# Patient Record
Sex: Male | Born: 1955 | Race: White | Hispanic: No | Marital: Married | Smoking: Former smoker
Health system: Southern US, Community
[De-identification: ages and names within clinical notes are randomized; demographics above are authoritative.]

## PROBLEM LIST (undated history)

## (undated) DIAGNOSIS — E785 Hyperlipidemia, unspecified: Secondary | ICD-10-CM

## (undated) DIAGNOSIS — I10 Essential (primary) hypertension: Secondary | ICD-10-CM

## (undated) HISTORY — DX: Essential (primary) hypertension: I10

## (undated) HISTORY — PX: APPENDECTOMY: SHX54

## (undated) HISTORY — DX: Hyperlipidemia, unspecified: E78.5

---

## 2007-02-24 LAB — HEAVY METALS PROFILE II, BLOOD
Arsenic, Blood: 8
Mercury, Blood: 2

## 2010-02-16 LAB — LIPID PANEL
CHOLESTEROL: 296 mg/dL — AB (ref 0–200)
HDL: 75 mg/dL — AB (ref 35–70)
LDL CALC: 191 mg/dL
TRIGLYCERIDES: 154 mg/dL (ref 40–160)

## 2010-02-16 LAB — BASIC METABOLIC PANEL
Creatinine: 0.9 mg/dL (ref 0.6–1.3)
Potassium: 4.2 mmol/L (ref 3.4–5.3)

## 2010-02-16 LAB — PSA: PSA: 1

## 2010-02-16 LAB — OCCULT BLOOD (STOOL CUP TO LAB): OCCULT BLOOD, FECES: NEGATIVE

## 2015-01-23 ENCOUNTER — Ambulatory Visit (INDEPENDENT_AMBULATORY_CARE_PROVIDER_SITE_OTHER): Payer: 59 | Admitting: Family Medicine

## 2015-01-23 ENCOUNTER — Encounter: Payer: Self-pay | Admitting: Family Medicine

## 2015-01-23 VITALS — BP 146/86 | HR 74 | Ht 71.0 in | Wt 199.0 lb

## 2015-01-23 DIAGNOSIS — G4730 Sleep apnea, unspecified: Secondary | ICD-10-CM

## 2015-01-23 DIAGNOSIS — E559 Vitamin D deficiency, unspecified: Secondary | ICD-10-CM

## 2015-01-23 DIAGNOSIS — Z0000 Encounter for general adult medical examination without abnormal findings: Secondary | ICD-10-CM | POA: Diagnosis not present

## 2015-01-23 DIAGNOSIS — F418 Other specified anxiety disorders: Secondary | ICD-10-CM | POA: Diagnosis not present

## 2015-01-23 DIAGNOSIS — G4700 Insomnia, unspecified: Secondary | ICD-10-CM | POA: Diagnosis not present

## 2015-01-23 DIAGNOSIS — Z23 Encounter for immunization: Secondary | ICD-10-CM

## 2015-01-23 DIAGNOSIS — IMO0001 Reserved for inherently not codable concepts without codable children: Secondary | ICD-10-CM

## 2015-01-23 DIAGNOSIS — J302 Other seasonal allergic rhinitis: Secondary | ICD-10-CM

## 2015-01-23 DIAGNOSIS — E785 Hyperlipidemia, unspecified: Secondary | ICD-10-CM

## 2015-01-23 DIAGNOSIS — Z1211 Encounter for screening for malignant neoplasm of colon: Secondary | ICD-10-CM

## 2015-01-23 DIAGNOSIS — Z889 Allergy status to unspecified drugs, medicaments and biological substances status: Secondary | ICD-10-CM

## 2015-01-23 DIAGNOSIS — G4733 Obstructive sleep apnea (adult) (pediatric): Secondary | ICD-10-CM | POA: Insufficient documentation

## 2015-01-23 DIAGNOSIS — Z789 Other specified health status: Secondary | ICD-10-CM

## 2015-01-23 LAB — COMPREHENSIVE METABOLIC PANEL
ALT: 40 U/L (ref 9–46)
AST: 33 U/L (ref 10–35)
Albumin: 4.5 g/dL (ref 3.6–5.1)
Alkaline Phosphatase: 52 U/L (ref 40–115)
BUN: 14 mg/dL (ref 7–25)
CHLORIDE: 97 mmol/L — AB (ref 98–110)
CO2: 26 mmol/L (ref 20–31)
CREATININE: 0.81 mg/dL (ref 0.70–1.33)
Calcium: 9 mg/dL (ref 8.6–10.3)
GLUCOSE: 121 mg/dL — AB (ref 65–99)
Potassium: 4 mmol/L (ref 3.5–5.3)
SODIUM: 137 mmol/L (ref 135–146)
Total Bilirubin: 1.4 mg/dL — ABNORMAL HIGH (ref 0.2–1.2)
Total Protein: 7 g/dL (ref 6.1–8.1)

## 2015-01-23 LAB — CBC
HCT: 43.6 % (ref 39.0–52.0)
Hemoglobin: 15.2 g/dL (ref 13.0–17.0)
MCH: 31.7 pg (ref 26.0–34.0)
MCHC: 34.9 g/dL (ref 30.0–36.0)
MCV: 90.8 fL (ref 78.0–100.0)
MPV: 8.8 fL (ref 8.6–12.4)
PLATELETS: 247 10*3/uL (ref 150–400)
RBC: 4.8 MIL/uL (ref 4.22–5.81)
RDW: 13.4 % (ref 11.5–15.5)
WBC: 6.7 10*3/uL (ref 4.0–10.5)

## 2015-01-23 LAB — LIPID PANEL
CHOL/HDL RATIO: 3.1 ratio (ref <=5.0)
CHOLESTEROL: 289 mg/dL — AB (ref 125–200)
HDL: 94 mg/dL (ref >=40)
LDL CALC: 172 mg/dL — AB (ref <130)
Triglycerides: 113 mg/dL (ref <150)
VLDL: 23 mg/dL (ref <30)

## 2015-01-23 MED ORDER — ZOLPIDEM TARTRATE 10 MG PO TABS: 10.0000 mg | ORAL_TABLET | Freq: Every evening | ORAL | Status: DC | PRN

## 2015-01-23 MED ORDER — ALPRAZOLAM 0.25 MG PO TABS: 0.2500 mg | ORAL_TABLET | Freq: Three times a day (TID) | ORAL | Status: DC | PRN

## 2015-01-23 NOTE — Assessment & Plan Note (Signed)
I recommended a home sleep study. Patient declined and wishes to research on his own first. Recheck in one month.

## 2015-01-23 NOTE — Assessment & Plan Note (Signed)
Take Ambien

## 2015-01-23 NOTE — Assessment & Plan Note (Signed)
Check cholesterol panel. If panel is remarkably bad would recommend starting Livalo as he is intolerant to Lipitor

## 2015-01-23 NOTE — Assessment & Plan Note (Addendum)
Doing well. Obtain screening labs. Tdap and influenza vaccines given prior to discharge. Return in one month to recheck blood pressure. Colonoscopy ordered

## 2015-01-23 NOTE — Assessment & Plan Note (Signed)
Very infrequent usage of Xanax is okay

## 2015-01-23 NOTE — Patient Instructions (Addendum)
Thank you for coming in today. Call or go to the emergency room if you get worse, have trouble breathing, have chest pains, or palpitations.  1) Research sleep apnea 2) Colon Cancer Screening: Expect to be called soon about arranging for a colonoscopy to screen for colon cancer.  3) We will get labs today. My nurse will call you with results.  4) Return in 1 month for blood pressure check with me.

## 2015-01-23 NOTE — Progress Notes (Signed)
Spencer Wang is a 59 y.o. male who presents to [facility]  today for establish care and perform wellness visit.  Patient recently moved back to [place]. He works as a Manufacturing engineersemiconductor engineer.  He has several chronic health concerns including insomnia, situational anxiety, sleep apnea, elevated blood pressure, and elevated cholesterol. He feels well with no acute issues today. He would like refill of his Ambien and refill of his Xanax. He uses 0.25 mg of Xanax very intermittently. For example 1 pill bottle will typically last a half a year.  1) hyperlipidemia: Has been elevated in the past. Patient has a family history of coronary artery bypass surgery in his father. The patient in the past has been on Lipitor which caused muscle cramping. He does not take any medications currently.  2) elevated blood pressure: Patient has been told that he has elevated blood pressure in the past. He does not take blood pressure medications. He denies any chest pains or palpitations.  3) anxiety: Patient has situational anxiety. He's had a bad experience the dentist and his path in his youth in [place]. Typically his anxiety is well-controlled and not bothersome.  4) sleep apnea: Patient has been told he has sleep apnea. He is not sure if he's ever had a formal sleep study. He does not use a Pap machine. He notes he wakes from sleep occasionally gasping for breath. His stop bang is very positive (7/8).  5) patient states that his last colonoscopy was about 10 years ago. He thinks it was normal. He does have problems with his stomach in the past. 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  Vaccines: Patient is due for influenza and Tdap vaccines today.   Past Medical History  Diagnosis Date  . Hyperlipidemia   . Hypertension    Past Surgical History  Procedure Laterality Date  . Appendectomy     Social History  Substance Use Topics  .  Smoking status: Former Games developermoker  . Smokeless tobacco: Not on file  . Alcohol Use: 0.0 oz/week    0 Standard drinks or equivalent per week   family history includes Hyperlipidemia in his father; Hypertension in his father.  ROS as above Medications: Current Outpatient Prescriptions  Medication Sig Dispense Refill  . ALPRAZolam (XANAX) 0.25 MG tablet Take 1 tablet (0.25 mg total) by mouth 3 (three) times daily as needed for anxiety. 30 tablet 0  . zolpidem (AMBIEN) 10 MG tablet Take 1 tablet (10 mg total) by mouth at bedtime as needed for sleep. 30 tablet 3   No current facility-administered medications for this visit.   Allergies  Allergen Reactions  . Lipitor [Atorvastatin] Other (See Comments)    Myalgia     Exam:  BP 146/86 mmHg  Pulse 74  Ht 5\' 11"  (1.803 m)  Wt 199 lb (90.266 kg)  BMI 27.77 kg/m2 Gen: Well NAD HEENT: EOMI,  MMM large neck Lungs: Normal work of breathing. CTABL Heart: RRR no MRG Abd: NABS, Soft. Nondistended, Nontender Exts: Brisk capillary refill, warm and well perfused.   No results found for this or any previous visit (from the past 24 hour(s)). No results found.   Please see individual assessment and plan sections.

## 2015-01-24 DIAGNOSIS — E559 Vitamin D deficiency, unspecified: Secondary | ICD-10-CM | POA: Insufficient documentation

## 2015-01-24 LAB — HEPATITIS C ANTIBODY: HCV AB: NEGATIVE

## 2015-01-24 LAB — VITAMIN D 25 HYDROXY (VIT D DEFICIENCY, FRACTURES): VIT D 25 HYDROXY: 27 ng/mL — AB (ref 30–100)

## 2015-01-24 LAB — HIV ANTIBODY (ROUTINE TESTING W REFLEX): HIV: NONREACTIVE

## 2015-01-24 MED ORDER — VITAMIN D (ERGOCALCIFEROL) 1.25 MG (50000 UNIT) PO CAPS: 50000.0000 [IU] | ORAL_CAPSULE | ORAL | Status: DC

## 2015-01-24 NOTE — Progress Notes (Signed)
Quick Note:  Rx vit D. No pharmacy in system. Please fax to preferred pharmacy. ______

## 2015-01-24 NOTE — Addendum Note (Signed)
Addended by: Rodolph BongOREY, [place] Nabor S on: 01/24/2015 07:54 AM   Modules accepted: Orders

## 2015-01-24 NOTE — Progress Notes (Signed)
Quick Note:  1) Vit D is low. I will prescribe Vit D pills.  2) Cholesterol is bad. We will discuss this further at the 1 month blood pressure check. ______

## 2015-01-27 ENCOUNTER — Telehealth: Payer: Self-pay | Admitting: Family Medicine

## 2015-01-27 NOTE — Telephone Encounter (Signed)
Pt called anf                  Pt called and stated the pharmacy he needs him medication to go to [facility] [address] in [place].

## 2015-02-17 ENCOUNTER — Encounter: Payer: Self-pay | Admitting: Family Medicine

## 2015-02-17 DIAGNOSIS — G609 Hereditary and idiopathic neuropathy, unspecified: Secondary | ICD-10-CM | POA: Insufficient documentation

## 2015-02-22 ENCOUNTER — Encounter: Payer: Self-pay | Admitting: Family Medicine

## 2015-02-24 ENCOUNTER — Ambulatory Visit: Payer: 59 | Admitting: Family Medicine

## 2015-02-27 ENCOUNTER — Encounter: Payer: Self-pay | Admitting: Family Medicine

## 2015-02-27 ENCOUNTER — Ambulatory Visit (INDEPENDENT_AMBULATORY_CARE_PROVIDER_SITE_OTHER): Payer: 59 | Admitting: Family Medicine

## 2015-02-27 VITALS — BP 164/99 | HR 97 | Wt 200.0 lb

## 2015-02-27 DIAGNOSIS — I10 Essential (primary) hypertension: Secondary | ICD-10-CM | POA: Diagnosis not present

## 2015-02-27 DIAGNOSIS — E785 Hyperlipidemia, unspecified: Secondary | ICD-10-CM | POA: Diagnosis not present

## 2015-02-27 DIAGNOSIS — R7301 Impaired fasting glucose: Secondary | ICD-10-CM | POA: Insufficient documentation

## 2015-02-27 DIAGNOSIS — E559 Vitamin D deficiency, unspecified: Secondary | ICD-10-CM | POA: Diagnosis not present

## 2015-02-27 LAB — POCT GLYCOSYLATED HEMOGLOBIN (HGB A1C): HEMOGLOBIN A1C: 5.9

## 2015-02-27 MED ORDER — VITAMIN D (ERGOCALCIFEROL) 1.25 MG (50000 UNIT) PO CAPS: 50000.0000 [IU] | ORAL_CAPSULE | ORAL | Status: DC

## 2015-02-27 MED ORDER — HYDROCHLOROTHIAZIDE 25 MG PO TABS: 25.0000 mg | ORAL_TABLET | Freq: Every day | ORAL | Status: DC

## 2015-02-27 NOTE — Assessment & Plan Note (Signed)
A1c at goal. Focus on weight loss

## 2015-02-27 NOTE — Assessment & Plan Note (Signed)
Refill vitamin D 

## 2015-02-27 NOTE — Assessment & Plan Note (Signed)
Start hydrochlorothiazide and check one month

## 2015-02-27 NOTE — Progress Notes (Signed)
Spencer Wang is a 59 y.o. male who presents [facility] today for follow up blood pressure.   1) patient had elevated blood pressure last checked. Today it still elevated. He denies any chest pain palpitations or shortness of breath.  2) vitamin D: Patient was found to have low vitamin D levels at the last visit. He was prescribed 8 tablets of once weekly vitamin D 50,000 units. He only received 4 at the pharmacy. He thinks he has a refill left. He takes the medicine without any issues.  3) hyperlipidemia: Patient was found to have hyperlipidemia at the last visit. In the past he's had significant difficulty tolerating Lipitor due to myalgia.  4) impaired fasting glucose: At the last visit patient was found to have a blood sugar of 120s fasting. He denies any polyuria polydipsia or unintended weight loss.   Past Medical History  Diagnosis Date  . Hyperlipidemia   . Hypertension    Past Surgical History  Procedure Laterality Date  . Appendectomy     Social History  Substance Use Topics  . Smoking status: Former Games developermoker  . Smokeless tobacco: Not on file  . Alcohol Use: 0.0 oz/week    0 Standard drinks or equivalent per week   family history includes Hyperlipidemia in his father; Hypertension in his father.  ROS as above Medications: Current Outpatient Prescriptions  Medication Sig Dispense Refill  . ALPRAZolam (XANAX) 0.25 MG tablet Take 1 tablet (0.25 mg total) by mouth 3 (three) times daily as needed for anxiety. 30 tablet 0  . Vitamin D, Ergocalciferol, (DRISDOL) 50000 UNITS CAPS capsule Take 1 capsule (50,000 Units total) by mouth every 7 (seven) days. Take for 8 total doses(weeks) 4 capsule 0  . zolpidem (AMBIEN) 10 MG tablet Take 1 tablet (10 mg total) by mouth at bedtime as needed for sleep. 30 tablet 3  . hydrochlorothiazide (HYDRODIURIL) 25 MG tablet Take 1 tablet (25 mg total) by mouth daily. 30 tablet 0   No current  facility-administered medications for this visit.   Allergies  Allergen Reactions  . Lipitor [Atorvastatin] Other (See Comments)    Myalgia     Exam:  BP 164/99 mmHg  Pulse 97  Wt 200 lb (90.719 kg) Gen: Well NAD HEENT: EOMI,  MMM Lungs: Normal work of breathing. CTABL Heart: RRR no MRG Abd: NABS, Soft. Nondistended, Nontender Exts: Brisk capillary refill, warm and well perfused.   Results for orders placed or performed in visit on 02/27/15 (from the past 24 hour(s))  POCT HgB A1C     Status: None   Collection Time: 02/27/15  9:07 AM  Result Value Ref Range   Hemoglobin A1C 5.9    No results found.   Please see individual assessment and plan sections.

## 2015-02-27 NOTE — Assessment & Plan Note (Signed)
Consider starting pitivastatin at next visit

## 2015-03-26 ENCOUNTER — Other Ambulatory Visit: Payer: Self-pay | Admitting: Family Medicine

## 2015-04-18 ENCOUNTER — Encounter: Payer: Self-pay | Admitting: Family Medicine

## 2015-04-18 DIAGNOSIS — K635 Polyp of colon: Secondary | ICD-10-CM | POA: Insufficient documentation

## 2015-04-23 ENCOUNTER — Other Ambulatory Visit: Payer: Self-pay | Admitting: Family Medicine

## 2015-04-24 ENCOUNTER — Telehealth: Payer: Self-pay | Admitting: Family Medicine

## 2015-04-24 NOTE — Telephone Encounter (Signed)
Left VM for Pt advising he is due for a follow up on his hypertension. Callback information for scheduling provided.

## 2015-04-24 NOTE — Telephone Encounter (Signed)
I called pt to let him know he is due for a follow up on his Hypertension and he states he will call back to schedule an appointment time. Thanks, [employer]

## 2015-05-06 ENCOUNTER — Other Ambulatory Visit: Payer: Self-pay | Admitting: Family Medicine

## 2015-05-08 ENCOUNTER — Telehealth: Payer: Self-pay | Admitting: Family Medicine

## 2015-05-08 NOTE — Telephone Encounter (Signed)
I left message informing pt that he needs to call and schedule his f/u app on BP so he can get more refills

## 2015-05-11 ENCOUNTER — Ambulatory Visit (INDEPENDENT_AMBULATORY_CARE_PROVIDER_SITE_OTHER): Payer: 59 | Admitting: Family Medicine

## 2015-05-11 ENCOUNTER — Encounter: Payer: Self-pay | Admitting: Family Medicine

## 2015-05-11 VITALS — BP 151/85 | HR 75 | Ht 71.0 in | Wt 201.0 lb

## 2015-05-11 DIAGNOSIS — E559 Vitamin D deficiency, unspecified: Secondary | ICD-10-CM

## 2015-05-11 DIAGNOSIS — R21 Rash and other nonspecific skin eruption: Secondary | ICD-10-CM | POA: Diagnosis not present

## 2015-05-11 DIAGNOSIS — F418 Other specified anxiety disorders: Secondary | ICD-10-CM

## 2015-05-11 DIAGNOSIS — I10 Essential (primary) hypertension: Secondary | ICD-10-CM

## 2015-05-11 MED ORDER — CLOBETASOL PROPIONATE 0.05 % EX CREA: 1.0000 "application " | TOPICAL_CREAM | Freq: Two times a day (BID) | CUTANEOUS | Status: DC

## 2015-05-11 MED ORDER — ALPRAZOLAM 0.25 MG PO TABS: 0.2500 mg | ORAL_TABLET | Freq: Three times a day (TID) | ORAL | Status: DC | PRN

## 2015-05-11 MED ORDER — TRIAMCINOLONE ACETONIDE 0.1 % EX CREA: 1.0000 "application " | TOPICAL_CREAM | Freq: Two times a day (BID) | CUTANEOUS | Status: DC

## 2015-05-11 MED ORDER — HYDROCHLOROTHIAZIDE 25 MG PO TABS: 25.0000 mg | ORAL_TABLET | Freq: Every day | ORAL | Status: DC

## 2015-05-11 NOTE — Progress Notes (Signed)
       Spencer Wang is a 60 y.o. male who presents to [facility]: [facility] Care today for rash and follow-up hypertension.  1) rash: New rash present for the last week and a half. Rash occurred after patient had a colonoscopy. Likely he was outside gardening before the rash started. Nobody else in his household has a similar rash. He denies any new medications aside from the ones that were given as part of a colonoscopy. He denies any fevers chills nausea vomiting or diarrhea. He denies any new soap detergent cosmetics shampoos etc. He's tried some hydrocortisone cream which has helped that.  2) hypertension: Currently taking hydrochlorothiazide. He notes at home his blood pressure is typically well controlled with systolic blood pressure in the 120s. He denies any chest pain palpitations or shortness of breath.   Past Medical History  Diagnosis Date  . Hyperlipidemia   . Hypertension    Past Surgical History  Procedure Laterality Date  . Appendectomy     Social History  Substance Use Topics  . Smoking status: Former Games developer  . Smokeless tobacco: Not on file  . Alcohol Use: 0.0 oz/week    0 Standard drinks or equivalent per week   family history includes Hyperlipidemia in his father; Hypertension in his father.  ROS as above Medications: Current Outpatient Prescriptions  Medication Sig Dispense Refill  . ALPRAZolam (XANAX) 0.25 MG tablet Take 1 tablet (0.25 mg total) by mouth 3 (three) times daily as needed for anxiety. 30 tablet 0  . hydrochlorothiazide (HYDRODIURIL) 25 MG tablet Take 1 tablet (25 mg total) by mouth daily. 30 tablet 0  . zolpidem (AMBIEN) 10 MG tablet Take 1 tablet (10 mg total) by mouth at bedtime as needed for sleep. 30 tablet 3  . clobetasol cream (TEMOVATE) 0.05 % Apply 1 application topically 2 (two) times daily. 60 g 2  . triamcinolone cream (KENALOG) 0.1 % Apply 1  application topically 2 (two) times daily. 453.6 g 1   No current facility-administered medications for this visit.   Allergies  Allergen Reactions  . Lipitor [Atorvastatin] Other (See Comments)    Myalgia     Exam:  BP 151/85 mmHg  Pulse 75  Ht  (1.803 m)  Wt 201 lb (91.173 kg)  BMI 28.05 kg/m2 Gen: Well NAD HEENT: EOMI,  MMM Lungs: Normal work of breathing. CTABL Heart: RRR no MRG Abd: NABS, Soft. Nondistended, Nontender Exts: Brisk capillary refill, warm and well perfused.  Skin: Maculopapular erythematous blanching scattered rash on back. Nontender. No scaling or discharge.  No results found for this or any previous visit (from the past 24 hour(s)). No results found.   Please see individual assessment and plan sections.

## 2015-05-11 NOTE — Patient Instructions (Addendum)
Thank you for coming in today. Use the triamcinolone cream twice daily until rash goes away.  Use the small tube of clobetasol cream for the very bad spots twice daily.  Return in 1 month to recheck blood pressure and check your blood pressure cuff.    Rash A rash is a change in the color or texture of the skin. There are many different types of rashes. You may have other problems that accompany your rash. CAUSES   Infections.  Allergic reactions. This can include allergies to pets or foods.  Certain medicines.  Exposure to certain chemicals, soaps, or cosmetics.  Heat.  Exposure to poisonous plants.  Tumors, both cancerous and noncancerous. SYMPTOMS   Redness.  Scaly skin.  Itchy skin.  Dry or cracked skin.  Bumps.  Blisters.  Pain. DIAGNOSIS  Your caregiver may do a physical exam to determine what type of rash you have. A skin sample (biopsy) may be taken and examined under a microscope. TREATMENT  Treatment depends on the type of rash you have. Your caregiver may prescribe certain medicines. For serious conditions, you may need to see a skin doctor (dermatologist). HOME CARE INSTRUCTIONS   Avoid the substance that caused your rash.  Do not scratch your rash. This can cause infection.  You may take cool baths to help stop itching.  Only take over-the-counter or prescription medicines as directed by your caregiver.  Keep all follow-up appointments as directed by your caregiver. SEEK IMMEDIATE MEDICAL CARE IF:  You have increasing pain, swelling, or redness.  You have a fever.  You have new or severe symptoms.  You have body aches, diarrhea, or vomiting.  Your rash is not better after 3 days. MAKE SURE YOU:  Understand these instructions.  Will watch your condition.  Will get help right away if you are not doing well or get worse.   This information is not intended to replace advice given to you by your health care provider. Make sure you  discuss any questions you have with your health care provider.   Document Released: 02/22/2002 Document Revised: 03/25/2014 Document Reviewed: 07/20/2014 Elsevier Interactive Patient Education [employer]

## 2015-05-11 NOTE — Assessment & Plan Note (Signed)
Unclear etiology. Treat with triamcinolone and clobetasol cream and ointment. Return if not improving.

## 2015-05-11 NOTE — Assessment & Plan Note (Signed)
Check vitamin D. 

## 2015-05-11 NOTE — Assessment & Plan Note (Signed)
Check basic metabolic panel. Continue hydrochlorothiazide. Return in one month with home blood pressure cuff for calibration. Likely patient has whitecoat hypertension a normal blood pressure at home with current regimen.

## 2015-05-12 LAB — BASIC METABOLIC PANEL
BUN: 22 mg/dL (ref 7–25)
CALCIUM: 9.5 mg/dL (ref 8.6–10.3)
CO2: 25 mmol/L (ref 20–31)
Chloride: 98 mmol/L (ref 98–110)
Creat: 0.8 mg/dL (ref 0.70–1.33)
GLUCOSE: 122 mg/dL — AB (ref 65–99)
Potassium: 3.9 mmol/L (ref 3.5–5.3)
SODIUM: 136 mmol/L (ref 135–146)

## 2015-05-12 LAB — VITAMIN D 25 HYDROXY (VIT D DEFICIENCY, FRACTURES): VIT D 25 HYDROXY: 20 ng/mL — AB (ref 30–100)

## 2015-05-12 NOTE — Progress Notes (Signed)
Quick Note:  Kidneys look normal.  Vit is somehow lower.  Take 5000 units of over the counter vit D daily when you finish the prescription dose. ______

## 2015-12-05 ENCOUNTER — Ambulatory Visit (INDEPENDENT_AMBULATORY_CARE_PROVIDER_SITE_OTHER): Payer: 59 | Admitting: Family Medicine

## 2015-12-05 ENCOUNTER — Encounter: Payer: Self-pay | Admitting: Family Medicine

## 2015-12-05 VITALS — BP 138/84 | HR 82 | Wt 197.0 lb

## 2015-12-05 DIAGNOSIS — F418 Other specified anxiety disorders: Secondary | ICD-10-CM | POA: Diagnosis not present

## 2015-12-05 DIAGNOSIS — Z23 Encounter for immunization: Secondary | ICD-10-CM | POA: Diagnosis not present

## 2015-12-05 DIAGNOSIS — G4700 Insomnia, unspecified: Secondary | ICD-10-CM

## 2015-12-05 DIAGNOSIS — G4730 Sleep apnea, unspecified: Secondary | ICD-10-CM | POA: Diagnosis not present

## 2015-12-05 MED ORDER — ALPRAZOLAM 0.25 MG PO TABS: 0.2500 mg | ORAL_TABLET | Freq: Three times a day (TID) | ORAL | 5 refills | Status: DC | PRN

## 2015-12-05 MED ORDER — ZOLPIDEM TARTRATE 10 MG PO TABS: 10.0000 mg | ORAL_TABLET | Freq: Every evening | ORAL | 5 refills | Status: DC | PRN

## 2015-12-05 MED ORDER — HYDROCHLOROTHIAZIDE 25 MG PO TABS: 25.0000 mg | ORAL_TABLET | Freq: Every day | ORAL | 0 refills | Status: DC

## 2015-12-05 NOTE — Patient Instructions (Addendum)
Thank you for coming in today. Restart medicine.  You should hear from sleep study.  Return in 2 months.   Sleep Apnea  Sleep apnea is a sleep disorder characterized by abnormal pauses in breathing while you sleep. When your breathing pauses, the level of oxygen in your blood decreases. This causes you to move out of deep sleep and into light sleep. As a result, your quality of sleep is poor, and the system that carries your blood throughout your body (cardiovascular system) experiences stress. If sleep apnea remains untreated, the following conditions can develop:  High blood pressure (hypertension).  Coronary artery disease.  Inability to achieve or maintain an erection (impotence).  Impairment of your thought process (cognitive dysfunction). There are three types of sleep apnea: 1. Obstructive sleep apnea--Pauses in breathing during sleep because of a blocked airway. 2. Central sleep apnea--Pauses in breathing during sleep because the area of the brain that controls your breathing does not send the correct signals to the muscles that control breathing. 3. Mixed sleep apnea--A combination of both obstructive and central sleep apnea. RISK FACTORS The following risk factors can increase your risk of developing sleep apnea:  Being overweight.  Smoking.  Having narrow passages in your nose and throat.  Being of older age.  Being male.  Alcohol use.  Sedative and tranquilizer use.  Ethnicity. Among individuals younger than 35 years, African Americans are at increased risk of sleep apnea. SYMPTOMS   Difficulty staying asleep.  Daytime sleepiness and fatigue.  Loss of energy.  Irritability.  Loud, heavy snoring.  Morning headaches.  Trouble concentrating.  Forgetfulness.  Decreased interest in sex.  Unexplained sleepiness. DIAGNOSIS  In order to diagnose sleep apnea, your caregiver will perform a physical examination. A sleep study done in the comfort of your own  home may be appropriate if you are otherwise healthy. Your caregiver may also recommend that you spend the night in a sleep lab. In the sleep lab, several monitors record information about your heart, lungs, and brain while you sleep. Your leg and arm movements and blood oxygen level are also recorded. TREATMENT The following actions may help to resolve mild sleep apnea:  Sleeping on your side.   Using a decongestant if you have nasal congestion.   Avoiding the use of depressants, including alcohol, sedatives, and narcotics.   Losing weight and modifying your diet if you are overweight. There also are devices and treatments to help open your airway:  Oral appliances. These are custom-made mouthpieces that shift your lower jaw forward and slightly open your bite. This opens your airway.  Devices that create positive airway pressure. This positive pressure "splints" your airway open to help you breathe better during sleep. The following devices create positive airway pressure:  Continuous positive airway pressure (CPAP) device. The CPAP device creates a continuous level of air pressure with an air pump. The air is delivered to your airway through a mask while you sleep. This continuous pressure keeps your airway open.  Nasal expiratory positive airway pressure (EPAP) device. The EPAP device creates positive air pressure as you exhale. The device consists of single-use valves, which are inserted into each nostril and held in place by adhesive. The valves create very little resistance when you inhale but create much more resistance when you exhale. That increased resistance creates the positive airway pressure. This positive pressure while you exhale keeps your airway open, making it easier to breath when you inhale again.  Bilevel positive airway pressure (BPAP)  device. The BPAP device is used mainly in patients with central sleep apnea. This device is similar to the CPAP device because it also  uses an air pump to deliver continuous air pressure through a mask. However, with the BPAP machine, the pressure is set at two different levels. The pressure when you exhale is lower than the pressure when you inhale.  Surgery. Typically, surgery is only done if you cannot comply with less invasive treatments or if the less invasive treatments do not improve your condition. Surgery involves removing excess tissue in your airway to create a wider passage way.   This information is not intended to replace advice given to you by your health care provider. Make sure you discuss any questions you have with your health care provider.   Document Released: 02/22/2002 Document Revised: 03/25/2014 Document Reviewed: 07/11/2011 Elsevier Interactive Patient Education [employer]

## 2015-12-05 NOTE — Progress Notes (Signed)
Spencer Wang is a 60 y.o. male who presents to [facility] Medicine today for   Anxiety: Patient is a history of anxiety previously well managed with alprazolam. He notes anxiety is typically associated with work. 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 He is having trouble sleeping and trouble controlling worrying.  Insomnia: Patient is a history of insomnia that was previously well controlled with Ambien. He notes significant difficulty sleeping and is interested in restarting Ambien. He did not tolerate over-the-counter diphenhydramine very well.  Snoring: Patient has a history of snoring and his wife says he stops breathing at night. He does not feel rested and feels tired frequently during the day.  STOP BANG: Snore:     yes Tired:     yes Observed stop breathing:  yes Hypertension:   yes  BMI >35:   yes Age >50:   yes Neck > 16 inches:  yes Male gender:   yes ------------------------------------------ Total:     8/8   Hypertension: Patient is a history of hypertension previously well managed with hydrochlorothiazide. No chest pains palpitations shortness of breath. He's been out of this medicine for sometime now.   Past Medical History:  Diagnosis Date  . Hyperlipidemia   . Hypertension    Past Surgical History:  Procedure Laterality Date  . APPENDECTOMY     Social History  Substance Use Topics  . Smoking status: Former Games developermoker  . Smokeless tobacco: Not on file  . Alcohol use 0.0 oz/week   family history includes Hyperlipidemia in his father; Hypertension in his father.  ROS as above:  Medications: Current Outpatient Prescriptions  Medication Sig Dispense Refill  . ALPRAZolam (XANAX) 0.25 MG tablet Take 1 tablet (0.25 mg total) by mouth 3 (three) times daily as needed for anxiety. 60 tablet 5  .  hydrochlorothiazide (HYDRODIURIL) 25 MG tablet Take 1 tablet (25 mg total) by mouth daily. 90 tablet 0  . zolpidem (AMBIEN) 10 MG tablet Take 1 tablet (10 mg total) by mouth at bedtime as needed for sleep. 30 tablet 5   No current facility-administered medications for this visit.    Allergies  Allergen Reactions  . Lipitor [Atorvastatin] Other (See Comments)    Myalgia     Exam:  BP 138/84   Pulse 82   Wt 197 lb (89.4 kg)   BMI 27.48 kg/m    Gen: Well NAD HEENT: EOMI,  MMM Lungs: Normal work of breathing. CTABL Heart: RRR no MRG Abd: NABS, Soft. Nondistended, Nontender Exts: Brisk capillary refill, warm and well perfused.  Psych: Alert and oriented process and affect  Depression screen PHQ 2/9 12/05/2015  Decreased Interest 2  Down, Depressed, Hopeless 1  PHQ - 2 Score 3  Altered sleeping 3  Tired, decreased energy 1  Change in appetite 0  Feeling bad or failure about yourself  1  Trouble concentrating 1  Moving slowly or fidgety/restless 0  Suicidal thoughts 0  PHQ-9 Score 9  Difficult doing work/chores Somewhat difficult    GAD 7 : Generalized Anxiety Score 12/05/2015  Nervous, Anxious, on Edge 0  Control/stop worrying 1  Worry too much - different things 2  Trouble relaxing 1  Restless 0  Easily annoyed or irritable 0  Afraid - awful might happen 1  Total GAD 7 Score 5  Anxiety Difficulty Somewhat difficult       No results found for this or any  previous visit (from the past 24 hour(s)). No results found.    Assessment and Plan: 60 y.o. male with  Anxiety: Reasonably well-controlled. Continue Xanax. Recheck in about 2 months  Insomnia: Continue Ambien. Recheck in 2 months.  Probable sleep apnea: Positive stop bang. Obtain home sleep study. Check 2 months.  : hypertension Continue hydrochlorothiazide.  Flu vaccine given   Orders Placed This Encounter  Procedures  . Flu Vaccine QUAD 36+ mos IM  . Home sleep test    [facility]      Standing Status:   Future    Standing Expiration Date:   12/04/2016    Order Specific Question:   Where should this test be performed:    Answer:   Other    Discussed warning signs or symptoms. Please see discharge instructions. Patient expresses understanding.

## 2016-02-07 ENCOUNTER — Telehealth: Payer: Self-pay | Admitting: Family Medicine

## 2016-02-07 DIAGNOSIS — G4733 Obstructive sleep apnea (adult) (pediatric): Secondary | ICD-10-CM

## 2016-02-07 MED ORDER — AMBULATORY NON FORMULARY MEDICATION: 0 refills | Status: AC

## 2016-02-07 NOTE — Telephone Encounter (Signed)
Sleep study shows severe sleep apnea. We'll order CPAP machine.

## 2016-04-29 ENCOUNTER — Ambulatory Visit (INDEPENDENT_AMBULATORY_CARE_PROVIDER_SITE_OTHER): Payer: 59 | Admitting: Family Medicine

## 2016-04-29 ENCOUNTER — Encounter: Payer: Self-pay | Admitting: Family Medicine

## 2016-04-29 VITALS — BP 169/94 | HR 99 | Wt 201.0 lb

## 2016-04-29 DIAGNOSIS — F418 Other specified anxiety disorders: Secondary | ICD-10-CM | POA: Diagnosis not present

## 2016-04-29 DIAGNOSIS — G4733 Obstructive sleep apnea (adult) (pediatric): Secondary | ICD-10-CM | POA: Diagnosis not present

## 2016-04-29 DIAGNOSIS — I10 Essential (primary) hypertension: Secondary | ICD-10-CM

## 2016-04-29 MED ORDER — LISINOPRIL 20 MG PO TABS: 20.0000 mg | ORAL_TABLET | Freq: Every day | ORAL | 0 refills | Status: DC

## 2016-04-29 NOTE — Patient Instructions (Signed)
Thank you for coming in today. Return in 2 months for recheck.  You should hear from sleep medicine soon.  Please let me know if you do not hear soon.    START lisinopril.   Lisinopril tablets What is this medicine? LISINOPRIL (lyse IN oh pril) is an ACE inhibitor. This medicine is used to treat high blood pressure and heart failure. It is also used to protect the heart immediately after a heart attack. This medicine may be used for other purposes; ask your health care provider or pharmacist if you have questions. COMMON BRAND NAME(S): Prinivil, Zestril What should I tell my health care provider before I take this medicine? They need to know if you have any of these conditions: -diabetes -heart or blood vessel disease -kidney disease -low blood pressure -previous swelling of the tongue, face, or lips with difficulty breathing, difficulty swallowing, hoarseness, or tightening of the throat -an unusual or allergic reaction to lisinopril, other ACE inhibitors, insect venom, foods, dyes, or preservatives -pregnant or trying to get pregnant -breast-feeding How should I use this medicine? Take this medicine by mouth with a glass of water. Follow the directions on your prescription label. You may take this medicine with or without food. If it upsets your stomach, take it with food. Take your medicine at regular intervals. Do not take it more often than directed. Do not stop taking except on your doctor's advice. Talk to your pediatrician regarding the use of this medicine in children. Special care may be needed. While this drug may be prescribed for children as young as 79 years of age for selected conditions, precautions do apply. Overdosage: If you think you have taken too much of this medicine contact a poison control center or emergency room at once. NOTE: This medicine is only for you. Do not share this medicine with others. What if I miss a dose? If you miss a dose, take it as soon as you  can. If it is almost time for your next dose, take only that dose. Do not take double or extra doses. What may interact with this medicine? Do not take this medicine with any of the following medications: -hymenoptera venom -sacubitril; valsartan This medicines may also interact with the following medications: -aliskiren -angiotensin receptor blockers, like losartan or valsartan -certain medicines for diabetes -diuretics -everolimus -gold compounds -lithium -NSAIDs, medicines for pain and inflammation, like ibuprofen or naproxen -potassium salts or supplements -salt substitutes -sirolimus -temsirolimus This list may not describe all possible interactions. Give your health care provider a list of all the medicines, herbs, non-prescription drugs, or dietary supplements you use. Also tell them if you smoke, drink alcohol, or use illegal drugs. Some items may interact with your medicine. What should I watch for while using this medicine? Visit your doctor or health care professional for regular check ups. Check your blood pressure as directed. Ask your doctor what your blood pressure should be, and when you should contact him or her. Do not treat yourself for coughs, colds, or pain while you are using this medicine without asking your doctor or health care professional for advice. Some ingredients may increase your blood pressure. Women should inform their doctor if they wish to become pregnant or think they might be pregnant. There is a potential for serious side effects to an unborn child. Talk to your health care professional or pharmacist for more information. Check with your doctor or health care professional if you get an attack of severe diarrhea, nausea and  vomiting, or if you sweat a lot. The loss of too much body fluid can make it dangerous for you to take this medicine. You may get drowsy or dizzy. Do not drive, use machinery, or do anything that needs mental alertness until you know  how this drug affects you. Do not stand or sit up quickly, especially if you are an older patient. This reduces the risk of dizzy or fainting spells. Alcohol can make you more drowsy and dizzy. Avoid alcoholic drinks. Avoid salt substitutes unless you are told otherwise by your doctor or health care professional. What side effects may I notice from receiving this medicine? Side effects that you should report to your doctor or health care professional as soon as possible: -allergic reactions like skin rash, itching or hives, swelling of the hands, feet, face, lips, throat, or tongue -breathing problems -signs and symptoms of kidney injury like trouble passing urine or change in the amount of urine -signs and symptoms of increased potassium like muscle weakness; chest pain; or fast, irregular heartbeat -signs and symptoms of liver injury like dark yellow or brown urine; general ill feeling or flu-like symptoms; light-colored stools; loss of appetite; nausea; right upper belly pain; unusually weak or tired; yellowing of the eyes or skin -signs and symptoms of low blood pressure like dizziness; feeling faint or lightheaded, falls; unusually weak or tired -stomach pain with or without nausea and vomiting Side effects that usually do not require medical attention (report to your doctor or health care professional if they continue or are bothersome): -changes in taste -cough -dizziness -fever -headache -sensitivity to light This list may not describe all possible side effects. Call your doctor for medical advice about side effects. You may report side effects to FDA at 1-800-FDA-1088. Where should I keep my medicine? Keep out of the reach of children. Store at room temperature between 15 and 30 degrees C (59 and 86 degrees F). Protect from moisture. Keep container tightly closed. Throw away any unused medicine after the expiration date. NOTE: This sheet is a summary. It may not cover all possible  information. If you have questions about this medicine, talk to your doctor, pharmacist, or health care provider.  2017 Elsevier/Gold Standard (2015-04-24 12:52:35)

## 2016-04-29 NOTE — Progress Notes (Signed)
Spencer Wang is a 61 y.o. male who presents to [facility]: Primary Care Sports Medicine today for follow-up anxiety hypertension and discuss CPAP machine.  Hypertension: Patient stopped taking his hydrochlorothiazide. He feels as though it's not very effective. He measures his home blood pressure and gets measurements from 160/90-120/70. He notes he tolerated the medicine. He well but noted some extra urination on it. He denies chest pain or palpitations shortness of breath.  CPAP: Patient has sleep apnea and uses CPAP with a titration setting up to 15 cm of water. He notes the machine tends to wake him up at night with over pressurization. He finds it to be pretty obnoxious and has tried multiple different settings with the durable medical supply company. This has not helped much.  Anxiety: Patient has chronic anxiety and seems to be doing pretty well with some lifestyle changes and occasional Xanax. He notes job stress as his main source of stress. His work is currently undergoing layoffs and is very anxious about it.   Past Medical History:  Diagnosis Date  . Hyperlipidemia   . Hypertension    Past Surgical History:  Procedure Laterality Date  . APPENDECTOMY     Social History  Substance Use Topics  . Smoking status: Former Games developer  . Smokeless tobacco: Never Used  . Alcohol use 0.0 oz/week   family history includes Hyperlipidemia in his father; Hypertension in his father.  ROS as above:  Medications: Current Outpatient Prescriptions  Medication Sig Dispense Refill  . ALPRAZolam (XANAX) 0.25 MG tablet Take 1 tablet (0.25 mg total) by mouth 3 (three) times daily as needed for anxiety. 60 tablet 5  . AMBULATORY NON FORMULARY MEDICATION Continuous positive airway pressure (CPAP) machine auto-titrate to a max pressure of 15 cm of H2O pressure, with all supplemental supplies as needed. 1  each 0  . lisinopril (PRINIVIL,ZESTRIL) 20 MG tablet Take 1 tablet (20 mg total) by mouth daily. 90 tablet 0  . zolpidem (AMBIEN) 10 MG tablet Take 1 tablet (10 mg total) by mouth at bedtime as needed for sleep. 30 tablet 5   No current facility-administered medications for this visit.    Allergies  Allergen Reactions  . Lipitor [Atorvastatin] Other (See Comments)    Myalgia    Health Maintenance Health Maintenance  Topic Date Due  . ZOSTAVAX  12/17/2015  . COLONOSCOPY  04/16/2020  . TETANUS/TDAP  01/22/2025  . INFLUENZA VACCINE  Completed  . Hepatitis C Screening  Completed  . HIV Screening  Completed     Exam:  BP (!) 169/94   Pulse 99   Wt 201 lb (91.2 kg)   BMI 28.03 kg/m  Gen: Well NAD HEENT: EOMI,  MMM Lungs: Normal work of breathing. CTABL Heart: RRR no MRG Abd: NABS, Soft. Nondistended, Nontender Exts: Brisk capillary refill, warm and well perfused.  Psych: Normal speech thought process and affect. Slightly anxious appearing Depression screen [facility] 2/9 04/29/2016 12/05/2015  Decreased Interest 1 2  Down, Depressed, Hopeless 1 1  PHQ - 2 Score 2 3  Altered sleeping 3 3  Tired, decreased energy 1 1  Change in appetite 0 0  Feeling bad or failure about yourself  1 1  Trouble concentrating 0 1  Moving slowly or fidgety/restless 0 0  Suicidal thoughts 0 0  PHQ-9 Score 7 9  Difficult doing work/chores - Somewhat difficult   GAD 7 : Generalized Anxiety Score 04/29/2016 12/05/2015  Nervous, Anxious, on Edge  2 0  Control/stop worrying 1 1  Worry too much - different things 2 2  Trouble relaxing 1 1  Restless 0 0  Easily annoyed or irritable 0 0  Afraid - awful might happen 1 1  Total GAD 7 Score 7 5  Anxiety Difficulty - Somewhat difficult      No results found for this or any previous visit (from the past 72 hour(s)). No results found.    Assessment and Plan: 61 y.o. male with  Hypertension: Not well controlled. Stop lisinopril start  hydrochlorothiazide. Recheck in about 2 months.  Sleep apnea: Patient has difficulty tolerating CPAP despite multiple different settings. Plan to refer to sleep medicine consultation for further evaluation and potential management.  Anxiety: Generally well-controlled continue current regimen.   Orders Placed This Encounter  Procedures  . Ambulatory referral to Sleep Studies    Referral Priority:   Routine    Referral Type:   Consultation    Referral Reason:   Specialty Services Required    Requested Specialty:   Sleep Medicine    Number of Visits Requested:   1   Meds ordered this encounter  Medications  . DISCONTD: lisinopril (PRINIVIL,ZESTRIL) 20 MG tablet    Sig: Take 1 tablet (20 mg total) by mouth daily.    Dispense:  90 tablet    Refill:  0  . lisinopril (PRINIVIL,ZESTRIL) 20 MG tablet    Sig: Take 1 tablet (20 mg total) by mouth daily.    Dispense:  90 tablet    Refill:  0     Discussed warning signs or symptoms. Please see discharge instructions. Patient expresses understanding.

## 2016-06-27 ENCOUNTER — Other Ambulatory Visit: Payer: Self-pay | Admitting: Family Medicine

## 2016-06-27 DIAGNOSIS — G4700 Insomnia, unspecified: Secondary | ICD-10-CM

## 2016-06-28 MED ORDER — ZOLPIDEM TARTRATE 10 MG PO TABS: 10.0000 mg | ORAL_TABLET | Freq: Every evening | ORAL | 5 refills | Status: DC | PRN

## 2016-06-28 NOTE — Telephone Encounter (Signed)
Rx faxed.  Pt advised.  

## 2016-07-20 ENCOUNTER — Other Ambulatory Visit: Payer: Self-pay | Admitting: Family Medicine

## 2016-07-20 DIAGNOSIS — I10 Essential (primary) hypertension: Secondary | ICD-10-CM

## 2016-08-19 ENCOUNTER — Other Ambulatory Visit: Payer: Self-pay | Admitting: Family Medicine

## 2016-08-19 DIAGNOSIS — I10 Essential (primary) hypertension: Secondary | ICD-10-CM

## 2016-09-28 ENCOUNTER — Other Ambulatory Visit: Payer: Self-pay | Admitting: Family Medicine

## 2016-09-28 DIAGNOSIS — I10 Essential (primary) hypertension: Secondary | ICD-10-CM

## 2016-09-30 ENCOUNTER — Encounter: Payer: Self-pay | Admitting: Family Medicine

## 2016-09-30 ENCOUNTER — Ambulatory Visit (INDEPENDENT_AMBULATORY_CARE_PROVIDER_SITE_OTHER): Payer: 59 | Admitting: Family Medicine

## 2016-09-30 VITALS — BP 147/90 | HR 81 | Ht 71.0 in | Wt 198.0 lb

## 2016-09-30 DIAGNOSIS — K4090 Unilateral inguinal hernia, without obstruction or gangrene, not specified as recurrent: Secondary | ICD-10-CM

## 2016-09-30 DIAGNOSIS — F418 Other specified anxiety disorders: Secondary | ICD-10-CM

## 2016-09-30 DIAGNOSIS — I10 Essential (primary) hypertension: Secondary | ICD-10-CM

## 2016-09-30 DIAGNOSIS — E785 Hyperlipidemia, unspecified: Secondary | ICD-10-CM

## 2016-09-30 LAB — CBC
HEMATOCRIT: 41.6 % (ref 38.5–50.0)
Hemoglobin: 14.2 g/dL (ref 13.2–17.1)
MCH: 32.6 pg (ref 27.0–33.0)
MCHC: 34.1 g/dL (ref 32.0–36.0)
MCV: 95.4 fL (ref 80.0–100.0)
MPV: 8.5 fL (ref 7.5–12.5)
Platelets: 262 10*3/uL (ref 140–400)
RBC: 4.36 MIL/uL (ref 4.20–5.80)
RDW: 13.8 % (ref 11.0–15.0)
WBC: 7 10*3/uL (ref 3.8–10.8)

## 2016-09-30 MED ORDER — ALPRAZOLAM 0.25 MG PO TABS: 0.2500 mg | ORAL_TABLET | Freq: Three times a day (TID) | ORAL | 5 refills | Status: DC | PRN

## 2016-09-30 MED ORDER — LISINOPRIL 20 MG PO TABS: 20.0000 mg | ORAL_TABLET | Freq: Every day | ORAL | 1 refills | Status: DC

## 2016-09-30 NOTE — Progress Notes (Signed)
Spencer Wang is a 61 y.o. male who presents [facility]: Primary [facility] Medicine today for hernia and hypertension and hyperlipidemia.  Hernia: Patient notes a month-long history of bulging in the right groin associated with mild pain. He denies any difficulty urinating or defecating. No fevers or chills or severe abdominal pain. He denies any personal history of hernia.  Hypertension: Patient typically takes lisinopril 20 mg daily. He forgot to take his medications today. He denies chest pain palpitations or shortness of breath.  Anxiety: Patient does well with intermittent Xanax. He would like a refill today if possible.  Hyperlipidemia: Patient does not take any medications for cholesterol. He notes it's been sometime since his last lab was checked.   Past Medical History:  Diagnosis Date  . Hyperlipidemia   . Hypertension    Past Surgical History:  Procedure Laterality Date  . APPENDECTOMY     Social History  Substance Use Topics  . Smoking status: Former Games developermoker  . Smokeless tobacco: Never Used  . Alcohol use 0.0 oz/week   family history includes Hyperlipidemia in his father; Hypertension in his father.  ROS as above:  Medications: Current Outpatient Prescriptions  Medication Sig Dispense Refill  . ALPRAZolam (XANAX) 0.25 MG tablet Take 1 tablet (0.25 mg total) by mouth 3 (three) times daily as needed for anxiety. 60 tablet 5  . AMBULATORY NON FORMULARY MEDICATION Continuous positive airway pressure (CPAP) machine auto-titrate to a max pressure of 15 cm of H2O pressure, with all supplemental supplies as needed. 1 each 0  . lisinopril (PRINIVIL,ZESTRIL) 20 MG tablet Take 1 tablet (20 mg total) by mouth daily. 90 tablet 1  . zolpidem (AMBIEN) 10 MG tablet Take 1 tablet (10 mg total) by mouth at bedtime as needed for sleep. 30 tablet 5   No current facility-administered  medications for this visit.    Allergies  Allergen Reactions  . Lipitor [Atorvastatin] Other (See Comments)    Myalgia    Health Maintenance Health Maintenance  Topic Date Due  . INFLUENZA VACCINE  10/16/2016  . COLONOSCOPY  04/16/2020  . TETANUS/TDAP  01/22/2025  . Hepatitis C Screening  Completed  . HIV Screening  Completed     Exam:  BP (!) 147/90   Pulse 81   Ht 5\' 11"  (1.803 m)   Wt 198 lb (89.8 kg)   BMI 27.62 kg/m  Gen: Well NAD HEENT: EOMI,  MMM Lungs: Normal work of breathing. CTABL Heart: RRR no MRG Abd: NABS, Soft. Nondistended, Nontender.  Groin: Slightly swollen right inguinal area. Reducible inguinal hernia present on the right. No testicular masses palpated. Not particularly tender. Exts: Brisk capillary refill, warm and well perfused.    No results found for this or any previous visit (from the past 72 hour(s)). No results found.    Assessment and Plan: 61 y.o. male with inguinal hernia right sided. Presenting over the last month. The hernia is currently reducible. However I think as the hernia is mildly enlarged think it's reasonable to discuss with a general surgeon about repair. Referral placed.  Hypertension: Blood pressure elevated today. Plan to resume lisinopril and check fasting labs in the near future.  Anxiety: Doing well continue Xanax as needed.  Hyperlipidemia: We'll recheck fasting lipids in the near future.   Orders Placed This Encounter  Procedures  . CBC  . COMPLETE METABOLIC PANEL WITH GFR  . Lipid Panel w/reflex Direct LDL  . Ambulatory referral to General  Surgery    Referral Priority:   Routine    Referral Type:   Surgical    Referral Reason:   Specialty Services Required    Requested Specialty:   General Surgery    Number of Visits Requested:   1   Meds ordered this encounter  Medications  . lisinopril (PRINIVIL,ZESTRIL) 20 MG tablet    Sig: Take 1 tablet (20 mg total) by mouth daily.    Dispense:  90 tablet     Refill:  1  . ALPRAZolam (XANAX) 0.25 MG tablet    Sig: Take 1 tablet (0.25 mg total) by mouth 3 (three) times daily as needed for anxiety.    Dispense:  60 tablet    Refill:  5     Discussed warning signs or symptoms. Please see discharge instructions. Patient expresses understanding.

## 2016-09-30 NOTE — Patient Instructions (Signed)
Thank you for coming in today. Restart blood pressure medicine.  Keep a blood pressure log.  You should hear from [facility] soon about your appointment.   Go to the Emergency room or call if you get a lot worse.    Inguinal Hernia, Adult An inguinal hernia is when fat or the intestines push through the area where the leg meets the lower abdomen (groin) and create a rounded lump (bulge). This condition develops over time. There are three types of inguinal hernias. These types include:  Hernias that can be pushed back into the belly (are reducible).  Hernias that are not reducible (are incarcerated).  Hernias that are not reducible and lose their blood supply (are strangulated). This type of hernia requires emergency surgery.  What are the causes? This condition is caused by having a weak spot in the muscles or tissue. This weakness lets the hernia poke through. This condition can be triggered by:  Suddenly straining the muscles of the lower abdomen.  Lifting heavy objects.  Straining to have a bowel movement. Difficult bowel movements (constipation) can lead to this.  Coughing.  What increases the risk? This condition is more likely to develop in:  Men.  Pregnant women.  People who: ? Are overweight. ? Work in jobs that require long periods of standing or heavy lifting. ? Have had an inguinal hernia before. ? Smoke or have lung disease. These factors can lead to long-lasting (chronic) coughing.  What are the signs or symptoms? Symptoms can depend on the size of the hernia. Often, a small inguinal hernia has no symptoms. Symptoms of a larger hernia include:  A lump in the groin. This is easier to see when the person is standing. It might not be visible when he or she is lying down.  Pain or burning in the groin. This occurs especially when lifting, straining, or coughing.  A dull ache or a feeling of pressure in the groin.  A lump in the scrotum in  men.  Symptoms of a strangulated inguinal hernia can include:  A bulge in the groin that is very painful and tender to the touch.  A bulge that turns red or purple.  Fever, nausea, and vomiting.  The inability to have a bowel movement or to pass gas.  How is this diagnosed? This condition is diagnosed with a medical history and physical exam. Your health care provider may feel your groin area and ask you to cough. How is this treated? Treatment for this condition varies depending on the size of your hernia and whether you have symptoms. If you do not have symptoms, your health care provider may have you watch your hernia carefully and come in for follow-up visits. If your hernia is larger or if you have symptoms, your treatment will include surgery. Follow these instructions at home: Lifestyle  Drink enough fluid to keep your urine clear or pale yellow.  Eat a diet that includes a lot of fiber. Eat plenty of fruits, vegetables, and whole grains. Talk with your health care provider if you have questions.  Avoid lifting heavy objects.  Avoid standing for long periods of time.  Do not use tobacco products, including cigarettes, chewing tobacco, or e-cigarettes. If you need help quitting, ask your health care provider.  Maintain a healthy weight. General instructions  Do not try to force the hernia back in.  Watch your hernia for any changes in color or size. Let your health care provider know if any  changes occur.  Take over-the-counter and prescription medicines only as told by your health care provider.  Keep all follow-up visits as told by your health care provider. This is important. Contact a health care provider if:  You have a fever.  You have new symptoms.  Your symptoms get worse. Get help right away if:  You have pain in the groin that suddenly gets worse.  A bulge in the groin gets bigger suddenly and does not go down.  You are a man and you have a sudden  pain in the scrotum, or the size of your scrotum suddenly changes.  A bulge in the groin area becomes red or purple and is painful to the touch.  You have nausea or vomiting that does not go away.  You feel your heart beating a lot more quickly than normal.  You cannot have a bowel movement or pass gas. This information is not intended to replace advice given to you by your health care provider. Make sure you discuss any questions you have with your health care provider. Document Released: 07/21/2008 Document Revised: 08/10/2015 Document Reviewed: 01/12/2014 Elsevier Interactive Patient Education  2018 [employer]

## 2016-10-01 LAB — COMPLETE METABOLIC PANEL WITH GFR
ALK PHOS: 47 U/L (ref 40–115)
ALT: 43 U/L (ref 9–46)
AST: 33 U/L (ref 10–35)
Albumin: 4.5 g/dL (ref 3.6–5.1)
BUN: 17 mg/dL (ref 7–25)
CALCIUM: 9.3 mg/dL (ref 8.6–10.3)
CHLORIDE: 100 mmol/L (ref 98–110)
CO2: 25 mmol/L (ref 20–31)
Creat: 0.91 mg/dL (ref 0.70–1.25)
Glucose, Bld: 115 mg/dL — ABNORMAL HIGH (ref 65–99)
POTASSIUM: 4.3 mmol/L (ref 3.5–5.3)
Sodium: 136 mmol/L (ref 135–146)
Total Bilirubin: 0.7 mg/dL (ref 0.2–1.2)
Total Protein: 6.9 g/dL (ref 6.1–8.1)

## 2016-10-01 LAB — LIPID PANEL W/REFLEX DIRECT LDL
CHOL/HDL RATIO: 2.7 ratio (ref <5.0)
CHOLESTEROL: 287 mg/dL — AB (ref <200)
HDL: 105 mg/dL (ref >40)
LDL-Cholesterol: 165 mg/dL — ABNORMAL HIGH
NON-HDL CHOLESTEROL (CALC): 182 mg/dL — AB (ref <130)
TRIGLYCERIDES: 71 mg/dL (ref <150)

## 2016-10-01 MED ORDER — PRAVASTATIN SODIUM 40 MG PO TABS: 40.0000 mg | ORAL_TABLET | Freq: Every day | ORAL | 0 refills | Status: DC

## 2016-10-01 NOTE — Addendum Note (Signed)
Addended by: Rodolph BongOREY, Elmus Mathes S on: 10/01/2016 06:36 AM   Modules accepted: Orders

## 2016-12-24 ENCOUNTER — Other Ambulatory Visit: Payer: Self-pay | Admitting: Family Medicine

## 2017-01-24 ENCOUNTER — Other Ambulatory Visit: Payer: Self-pay

## 2017-01-24 DIAGNOSIS — G4700 Insomnia, unspecified: Secondary | ICD-10-CM

## 2017-01-24 MED ORDER — ZOLPIDEM TARTRATE 10 MG PO TABS: 10.0000 mg | ORAL_TABLET | Freq: Every evening | ORAL | 0 refills | Status: DC | PRN

## 2017-03-17 ENCOUNTER — Other Ambulatory Visit: Payer: Self-pay | Admitting: Family Medicine

## 2017-03-17 DIAGNOSIS — G4700 Insomnia, unspecified: Secondary | ICD-10-CM

## 2017-03-24 ENCOUNTER — Other Ambulatory Visit: Payer: Self-pay | Admitting: Family Medicine

## 2017-04-10 ENCOUNTER — Ambulatory Visit (INDEPENDENT_AMBULATORY_CARE_PROVIDER_SITE_OTHER): Payer: 59 | Admitting: Osteopathic Medicine

## 2017-04-10 ENCOUNTER — Encounter: Payer: Self-pay | Admitting: Osteopathic Medicine

## 2017-04-10 VITALS — BP 130/97 | HR 87 | Temp 98.3°F | Wt 202.0 lb

## 2017-04-10 DIAGNOSIS — B9789 Other viral agents as the cause of diseases classified elsewhere: Secondary | ICD-10-CM | POA: Diagnosis not present

## 2017-04-10 DIAGNOSIS — J069 Acute upper respiratory infection, unspecified: Secondary | ICD-10-CM

## 2017-04-10 MED ORDER — IPRATROPIUM BROMIDE 0.06 % NA SOLN: 2.0000 | Freq: Four times a day (QID) | NASAL | 1 refills | Status: DC

## 2017-04-10 MED ORDER — AZITHROMYCIN 250 MG PO TABS: ORAL_TABLET | ORAL | 0 refills | Status: DC

## 2017-04-10 MED ORDER — BENZONATATE 200 MG PO CAPS: 200.0000 mg | ORAL_CAPSULE | Freq: Three times a day (TID) | ORAL | 0 refills | Status: DC | PRN

## 2017-04-10 NOTE — Progress Notes (Signed)
HPI: Spencer Wang is a 62 y.o. male who  has a past medical history of Hyperlipidemia and Hypertension.  he presents to [facility] [place] today, 04/10/17,  for chief complaint of: Illness, cough  Dry cough for several days, coughing up some mucus at this point. No fever or shortness of breath. No blood in the mucus. Sinus congestion/runny nose, very mild scratchy throat. Over-the-counter Robitussin has helped the cough.   Past medical, surgical, social and family history reviewed:  Patient Active Problem List   Diagnosis Date Noted  . Rash and nonspecific skin eruption 05/11/2015  . Sessile colonic polyp 04/18/2015  . IFG (impaired fasting glucose) 02/27/2015  . Essential hypertension 02/27/2015  . Peripheral neuropathy, idiopathic 02/17/2015  . Vitamin D deficiency 01/24/2015  . Situational anxiety 01/23/2015  . Insomnia 01/23/2015  . Dyslipidemia 01/23/2015  . Statin intolerance 01/23/2015  . Seasonal allergies 01/23/2015  . OSA (obstructive sleep apnea) 01/23/2015    Past Surgical History:  Procedure Laterality Date  . APPENDECTOMY      Social History   Tobacco Use  . Smoking status: Former Games developermoker  . Smokeless tobacco: Never Used  Substance Use Topics  . Alcohol use: Yes    Alcohol/week: 0.0 oz    Family History  Problem Relation Age of Onset  . Hypertension Father   . Hyperlipidemia Father      Current medication list and allergy/intolerance information reviewed:    Current Outpatient Medications  Medication Sig Dispense Refill  . ALPRAZolam (XANAX) 0.25 MG tablet Take 1 tablet (0.25 mg total) by mouth 3 (three) times daily as needed for anxiety. 60 tablet 5  . AMBULATORY NON FORMULARY MEDICATION Continuous positive airway pressure (CPAP) machine auto-titrate to a max pressure of 15 cm of H2O pressure, with all supplemental supplies as needed. 1 each 0  . lisinopril (PRINIVIL,ZESTRIL) 20 MG tablet Take 1 tablet (20 mg total)  by mouth daily. 90 tablet 1  . pravastatin (PRAVACHOL) 40 MG tablet Take 1 tablet (40 mg total) by mouth daily. APPOINTMENT NEEDED FOR FURTHER REFILLS 30 tablet 0  . zolpidem (AMBIEN) 10 MG tablet TAKE 1 TABLET AT BEDTIME AS NEEDED SLEEP 30 tablet 2   No current facility-administered medications for this visit.     Allergies  Allergen Reactions  . Lipitor [Atorvastatin] Other (See Comments)    Myalgia      Review of Systems:  Constitutional:  No  fever, no chills, +recent illness, No unintentional weight changes. +significant fatigue.   HEENT: No  headache, no vision change, no hearing change, +sore throat, +sinus pressure  Cardiac: No  chest pain, No  pressure, No palpitation  Respiratory:  No  shortness of breath. +Cough  Gastrointestinal: No  abdominal pain, No  nausea   Exam:  BP (!) 130/97   Pulse 87   Temp 98.3 F (36.8 C) (Oral)   Wt 202 lb (91.6 kg)   SpO2 98%   BMI 28.17 kg/m   Constitutional: VS see above. General Appearance: alert, well-developed, well-nourished, NAD  Eyes: Normal lids and conjunctive, non-icteric sclera  Ears, Nose, Mouth, Throat: MMM, Normal external inspection ears/nares/mouth/lips/gums. TM normal bilaterally. Pharynx/tonsils +erythema, no exudate. Nasal mucosa normal.   Neck: No masses, trachea midline. No thyroid enlargement. No tenderness/mass appreciated. No lymphadenopathy  Respiratory: Normal respiratory effort. no wheeze, no rhonchi, no rales  Cardiovascular: S1/S2 normal, no murmur, no rub/gallop auscultated. RRR.   Musculoskeletal: Gait normal.   Skin: warm, dry   Psychiatric: Normal judgment/insight.  Normal mood and affect. Oriented x3.    ASSESSMENT/PLAN:   Viral URI with cough   Meds ordered this encounter  Medications  . ipratropium (ATROVENT) 0.06 % nasal spray    Sig: Place 2 sprays into both nostrils 4 (four) times daily.    Dispense:  15 mL    Refill:  1  . benzonatate (TESSALON) 200 MG capsule    Sig:  Take 1 capsule (200 mg total) by mouth 3 (three) times daily as needed for cough.    Dispense:  30 capsule    Refill:  0  . azithromycin (ZITHROMAX) 250 MG tablet    Sig: 2 tabs po on Day 1, then 1 tab daily Days 2 - 5. Take this medicine if illness gets worse or lasts longer than 7-10 days    Dispense:  6 tablet    Refill:  0      Patient Instructions  Over-the-Counter Medications & Home Remedies for Upper Respiratory Illness  Note: the following list assumes no pregnancy, normal liver & kidney function and no other drug interactions. Dr. Lyn Hollingshead has highlighted medications which are safe for you to use, but these may not be appropriate for everyone. Always ask a pharmacist or qualified medical provider if you have any questions!   Aches/Pains, Fever, Headache Acetaminophen (Tylenol) 500 mg tablets - take max 2 tablets (1000 mg) every 6 hours (4 times per day)  Ibuprofen (Motrin) 200 mg tablets - take max 4 tablets (800 mg) every 6 hours*  Sinus Congestion Prescription Atrovent as directed Nasal Saline if desired Phenylephrine (Sudafed) 10 mg tablets every 4 hours (or the 12-hour formulation)* Diphenhydramine (Benadryl) 25 mg tablets - take max 2 tablets every 4 hours  Cough & Sore Throat Prescription cough mediicine as directed Dextromethorphan (Robitussin, others) - cough suppressant Guaifenesin (Robitussin, Mucinex, others) - expectorant (helps cough up mucus) (Dextromethorphan and Guaifenesin also come in a combination tablet) Lozenges w/ Benzocaine + Menthol (Cepacol) Honey - as much as you want! Teas which "coat the throat" - look for ingredients Elm Bark, Licorice Root, Marshmallow Root  Other Antibiotics if these are needed - take ALL, even if you're feeling better  Zinc Lozenges within 24 hours of symptoms onset - mixed evidence this shortens the duration of the common cold Don't waste your money on Vitamin C or Echinacea  *Caution in patients with high blood  pressure       Visit summary with medication list and pertinent instructions was printed for patient to review. All questions at time of visit were answered - patient instructed to contact office with any additional concerns. ER/RTC precautions were reviewed with the patient.   Follow-up plan: Return if symptoms worsen or fail to improve.    Please note: voice recognition software was used to produce this document, and typos may escape review. Please contact Dr. Lyn Hollingshead for any needed clarifications.

## 2017-04-10 NOTE — Patient Instructions (Signed)
Over-the-Counter Medications & Home Remedies for Upper Respiratory Illness  Note: the following list assumes no pregnancy, normal liver & kidney function and no other drug interactions. Dr. Lyn HollingsheadAlexander has highlighted medications which are safe for you to use, but these may not be appropriate for everyone. Always ask a pharmacist or qualified medical provider if you have any questions!   Aches/Pains, Fever, Headache Acetaminophen (Tylenol) 500 mg tablets - take max 2 tablets (1000 mg) every 6 hours (4 times per day)  Ibuprofen (Motrin) 200 mg tablets - take max 4 tablets (800 mg) every 6 hours*  Sinus Congestion Prescription Atrovent as directed Nasal Saline if desired Phenylephrine (Sudafed) 10 mg tablets every 4 hours (or the 12-hour formulation)* Diphenhydramine (Benadryl) 25 mg tablets - take max 2 tablets every 4 hours  Cough & Sore Throat Prescription cough mediicine as directed Dextromethorphan (Robitussin, others) - cough suppressant Guaifenesin (Robitussin, Mucinex, others) - expectorant (helps cough up mucus) (Dextromethorphan and Guaifenesin also come in a combination tablet) Lozenges w/ Benzocaine + Menthol (Cepacol) Honey - as much as you want! Teas which "coat the throat" - look for ingredients Elm Bark, Licorice Root, Marshmallow Root  Other Antibiotics if these are needed - take ALL, even if you're feeling better  Zinc Lozenges within 24 hours of symptoms onset - mixed evidence this shortens the duration of the common cold Don't waste your money on Vitamin C or Echinacea  *Caution in patients with high blood pressure

## 2017-05-06 ENCOUNTER — Other Ambulatory Visit: Payer: Self-pay | Admitting: Family Medicine

## 2017-05-06 DIAGNOSIS — I10 Essential (primary) hypertension: Secondary | ICD-10-CM

## 2017-09-15 ENCOUNTER — Other Ambulatory Visit: Payer: Self-pay | Admitting: Family Medicine

## 2017-09-15 DIAGNOSIS — G4700 Insomnia, unspecified: Secondary | ICD-10-CM

## 2017-09-17 ENCOUNTER — Encounter: Payer: Self-pay | Admitting: Family Medicine

## 2017-09-17 ENCOUNTER — Ambulatory Visit (INDEPENDENT_AMBULATORY_CARE_PROVIDER_SITE_OTHER): Payer: 59 | Admitting: Family Medicine

## 2017-09-17 VITALS — BP 141/71 | HR 97 | Ht 70.08 in | Wt 200.0 lb

## 2017-09-17 DIAGNOSIS — M25572 Pain in left ankle and joints of left foot: Secondary | ICD-10-CM | POA: Diagnosis not present

## 2017-09-17 DIAGNOSIS — M25561 Pain in right knee: Secondary | ICD-10-CM | POA: Diagnosis not present

## 2017-09-17 DIAGNOSIS — I10 Essential (primary) hypertension: Secondary | ICD-10-CM | POA: Diagnosis not present

## 2017-09-17 DIAGNOSIS — G8929 Other chronic pain: Secondary | ICD-10-CM

## 2017-09-17 DIAGNOSIS — E785 Hyperlipidemia, unspecified: Secondary | ICD-10-CM | POA: Diagnosis not present

## 2017-09-17 MED ORDER — DICLOFENAC SODIUM 1 % TD GEL: 4.0000 g | Freq: Four times a day (QID) | TRANSDERMAL | 11 refills | Status: AC

## 2017-09-17 MED ORDER — LISINOPRIL 20 MG PO TABS: 20.0000 mg | ORAL_TABLET | Freq: Every day | ORAL | 1 refills | Status: DC

## 2017-09-17 MED ORDER — NAPROXEN 500 MG PO TABS: 500.0000 mg | ORAL_TABLET | Freq: Two times a day (BID) | ORAL | 0 refills | Status: DC

## 2017-09-17 NOTE — Patient Instructions (Signed)
Thank you for coming in today. Take naproxen for pain as needed.  Apply the diclofenac gel.  10cm of water is a good CPAP setting.   Get xray and fasting labs in the near future.   Recheck with me in a few months.   If not better let me know.    Arthritis Arthritis is a term that is commonly used to refer to joint pain or joint disease. There are more than 100 types of arthritis. What are the causes? The most common cause of this condition is wear and tear of a joint. Other causes include:  Gout.  Inflammation of a joint.  An infection of a joint.  Sprains and other injuries near the joint.  A drug reaction or allergic reaction.  In some cases, the cause may not be known. What are the signs or symptoms? The main symptom of this condition is pain in the joint with movement. Other symptoms include:  Redness, swelling, or stiffness at a joint.  Warmth coming from the joint.  Fever.  Overall feeling of illness.  How is this diagnosed? This condition may be diagnosed with a physical exam and tests, including:  Blood tests.  Urine tests.  Imaging tests, such as MRI, X-rays, or a CT scan.  Sometimes, fluid is removed from a joint for testing. How is this treated? Treatment for this condition may involve:  Treatment of the cause, if it is known.  Rest.  Raising (elevating) the joint.  Applying cold or hot packs to the joint.  Medicines to improve symptoms and reduce inflammation.  Injections of a steroid such as cortisone into the joint to help reduce pain and inflammation.  Depending on the cause of your arthritis, you may need to make lifestyle changes to reduce stress on your joint. These changes may include exercising more and losing weight. Follow these instructions at home: Medicines  Take over-the-counter and prescription medicines only as told by your health care provider.  Do not take aspirin to relieve pain if gout is  suspected. Activity  Rest your joint if told by your health care provider. Rest is important when your disease is active and your joint feels painful, swollen, or stiff.  Avoid activities that make the pain worse. It is important to balance activity with rest.  Exercise your joint regularly with range-of-motion exercises as told by your health care provider. Try doing low-impact exercise, such as: ? Swimming. ? Water aerobics. ? Biking. ? Walking. Joint Care   If your joint is swollen, keep it elevated if told by your health care provider.  If your joint feels stiff in the morning, try taking a warm shower.  If directed, apply heat to the joint. If you have diabetes, do not apply heat without permission from your health care provider. ? Put a towel between the joint and the hot pack or heating pad. ? Leave the heat on the area for 20-30 minutes.  If directed, apply ice to the joint: ? Put ice in a plastic bag. ? Place a towel between your skin and the bag. ? Leave the ice on for 20 minutes, 2-3 times per day.  Keep all follow-up visits as told by your health care provider. This is important. Contact a health care provider if:  The pain gets worse.  You have a fever. Get help right away if:  You develop severe joint pain, swelling, or redness.  Many joints become painful and swollen.  You develop severe back pain.  You develop severe weakness in your leg.  You cannot control your bladder or bowels. This information is not intended to replace advice given to you by your health care provider. Make sure you discuss any questions you have with your health care provider. Document Released: 04/11/2004 Document Revised: 08/10/2015 Document Reviewed: 05/30/2014 Elsevier Interactive Patient Education  [employer].

## 2017-09-18 ENCOUNTER — Encounter: Payer: Self-pay | Admitting: Family Medicine

## 2017-09-18 NOTE — Progress Notes (Signed)
Spencer Wang is a 62 y.o. male who presents to [facility] today for left ankle and right knee pain, left hand pain, hypertension and hyperlipidemia.  Spencer Wang notes a several year history of intermittent mild left ankle pain.  This is been ongoing for years without a clear injury history.  He has not had much evaluation for notes that his medial anterior ankle become painful with excessive activity and also before it rains.  He denies any fevers or chills.  He uses occasional over-the-counter medications for pain as needed.  He denies clicking or locking.  Additionally the patient notes pain in the knees bilaterally weight worse than left.  He locates the pain in the anterior aspect of the knee.  He notes this is been present for a few weeks and worsened after a trip to [place].  He denies any specific injury to this area.  He denies locking or catching or severe swelling.  Pain is worse with activity better with rest.  Initially notes pain is bothersome with climbing stairs and seated with knee flexed for prolonged period of time.  Hypertension: Spencer Wang takes lisinopril daily with no chest pain palpitations or shortness of breath.  He notes his blood pressures typically better controlled at home than it is today.  Lightheadedness or dizziness.  Hyperlipidemia: Spencer Wang takes pravastatin daily.  No chest pain palpitations shortness of breath.  No severe muscle pains.  He tolerates it well.    ROS as above:  Exam:  BP (!) 141/71   Pulse 97   Ht 5' 10.08" (1.78 m)   Wt 200 lb (90.7 kg)   BMI 28.63 kg/m  Gen: Well NAD HEENT: EOMI,  MMM Lungs: Normal work of breathing. CTABL Heart: RRR no MRG Abd: NABS, Soft. Nondistended, Nontender Exts: Brisk capillary refill, warm and well perfused.  Right knee: Normal-appearing without effusion. Full range of motion. Stable  ligamentous exam.  Left ankle: No effusion normal motion.  Tender to palpation mildly at the anterior medial aspect of the knee.  Normal gait.  Lab and Radiology Results X-ray right knee and left ankle ordered.  Patient will get them in the near future.   Assessment and Plan: 62 y.o. male with  Left ankle pain: Concerning for DJD.  Unfortunately patient had an appointment to go to well and was unable to complete the full work-up prior to discharge.  Patient will get x-rays in a few days to help complete the diagnostic work-up.  However I think is reasonable to proceed with some limited home exercise program as well as diclofenac gel topically.  Additionally will use oral naproxen prescription strength is as needed for a few days.  Recheck in about a month.  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  Patellofemoral pain syndrome is an additional possibility.  As noted above work-up not completed fully today however it is pending with x-rays.  Plan for topical diclofenac gel and recheck in the near future.  Hypertension: Blood pressure not at goal today.  Plan for continued lisinopril and recheck metabolic panel.  Recheck in the near future blood pressure meds elevated consider increasing lisinopril dose or adding hydrochlorothiazide.  Hyperlipidemia: Lipids not well controlled at last check.  Plan to recheck lipid panel in the near future with fasting labs.  If not well controlled consider switching to a more effective statin.  Patient is nontolerant of Lipitor.   Orders Placed This Encounter  Procedures  .  DG Ankle Complete Left    Standing Status:   Future    Standing Expiration Date:   11/19/2018    Order Specific Question:   Reason for Exam (SYMPTOM  OR DIAGNOSIS REQUIRED)    Answer:   eval left ankle pain suspect DJD    Order Specific Question:   Preferred imaging location?    Answer:   [facility]    Order Specific Question:   Radiology Contrast Protocol - do NOT remove file path      Answer:   [email or web address]  . DG Knee Complete 4 Views Right    Please include patellar sunrise, lateral, and weightbearing bilateral AP and bilateral rosenberg views    Standing Status:   Future    Standing Expiration Date:   11/17/2018    Order Specific Question:   Reason for exam:    Answer:   Please include patellar sunrise, lateral, and weightbearing bilateral AP and bilateral rosenberg views    Comments:   Please include patellar sunrise, lateral, and weightbearing bilateral AP and bilateral rosenberg views    Order Specific Question:   Preferred imaging location?    Answer:   [facility]  . DG Knee 1-2 Views Left    Standing Status:   Future    Standing Expiration Date:   11/18/2018    Order Specific Question:   Reason for Exam (SYMPTOM  OR DIAGNOSIS REQUIRED)    Answer:   For use with right knee x-ray, bilateral AP and Rosenberg standing.    Order Specific Question:   Preferred imaging location?    Answer:   [facility]  . CBC  . COMPLETE METABOLIC PANEL WITH GFR  . Lipid Panel w/reflex Direct LDL   Meds ordered this encounter  Medications  . diclofenac sodium (VOLTAREN) 1 % GEL    Sig: Apply 4 g topically 4 (four) times daily. To affected joint.    Dispense:  100 g    Refill:  11  . naproxen (NAPROSYN) 500 MG tablet    Sig: Take 1 tablet (500 mg total) by mouth 2 (two) times daily with a meal.    Dispense:  28 tablet    Refill:  0  . lisinopril (PRINIVIL,ZESTRIL) 20 MG tablet    Sig: Take 1 tablet (20 mg total) by mouth daily.    Dispense:  90 tablet    Refill:  1     Historical information moved to improve visibility of documentation.  Past Medical History:  Diagnosis Date  . Hyperlipidemia   . Hypertension    Past Surgical History:  Procedure Laterality Date  . APPENDECTOMY     Social History   Tobacco Use  . Smoking status: Former Games developer  . Smokeless tobacco: Never Used  Substance Use  Topics  . Alcohol use: Yes    Alcohol/week: 0.0 oz   family history includes Hyperlipidemia in his father; Hypertension in his father.  Medications: Current Outpatient Medications  Medication Sig Dispense Refill  . ALPRAZolam (XANAX) 0.25 MG tablet Take 1 tablet (0.25 mg total) by mouth 3 (three) times daily as needed for anxiety. 60 tablet 5  . AMBULATORY NON FORMULARY MEDICATION Continuous positive airway pressure (CPAP) machine auto-titrate to a max pressure of 15 cm of H2O pressure, with all supplemental supplies as needed. 1 each 0  . ipratropium (ATROVENT) 0.06 % nasal spray Place 2 sprays into both nostrils 4 (four) times daily. 15 mL 1  . lisinopril (PRINIVIL,ZESTRIL) 20 MG tablet Take 1  tablet (20 mg total) by mouth daily. 90 tablet 1  . pravastatin (PRAVACHOL) 40 MG tablet Take 1 tablet (40 mg total) by mouth daily. APPOINTMENT NEEDED FOR FURTHER REFILLS 30 tablet 0  . zolpidem (AMBIEN) 10 MG tablet TAKE 1 TABLET AT BEDTIME AS NEEDED SLEEP 30 tablet 4  . diclofenac sodium (VOLTAREN) 1 % GEL Apply 4 g topically 4 (four) times daily. To affected joint. 100 g 11  . naproxen (NAPROSYN) 500 MG tablet Take 1 tablet (500 mg total) by mouth 2 (two) times daily with a meal. 28 tablet 0   No current facility-administered medications for this visit.    Allergies  Allergen Reactions  . Lipitor [Atorvastatin] Other (See Comments)    Myalgia     Discussed warning signs or symptoms. Please see discharge instructions. Patient expresses understanding.

## 2017-09-19 ENCOUNTER — Ambulatory Visit (INDEPENDENT_AMBULATORY_CARE_PROVIDER_SITE_OTHER): Payer: 59

## 2017-09-19 DIAGNOSIS — M25572 Pain in left ankle and joints of left foot: Secondary | ICD-10-CM | POA: Diagnosis not present

## 2017-09-19 DIAGNOSIS — M25562 Pain in left knee: Secondary | ICD-10-CM

## 2017-09-19 DIAGNOSIS — M25561 Pain in right knee: Secondary | ICD-10-CM | POA: Diagnosis not present

## 2017-09-19 DIAGNOSIS — G8929 Other chronic pain: Secondary | ICD-10-CM

## 2017-09-19 LAB — CBC
HEMATOCRIT: 41.6 % (ref 38.5–50.0)
Hemoglobin: 14.3 g/dL (ref 13.2–17.1)
MCH: 32.5 pg (ref 27.0–33.0)
MCHC: 34.4 g/dL (ref 32.0–36.0)
MCV: 94.5 fL (ref 80.0–100.0)
MPV: 9 fL (ref 7.5–12.5)
Platelets: 226 10*3/uL (ref 140–400)
RBC: 4.4 10*6/uL (ref 4.20–5.80)
RDW: 12.6 % (ref 11.0–15.0)
WBC: 7.5 10*3/uL (ref 3.8–10.8)

## 2017-09-19 LAB — LIPID PANEL W/REFLEX DIRECT LDL
CHOLESTEROL: 300 mg/dL — AB (ref <200)
HDL: 97 mg/dL (ref >40)
LDL Cholesterol (Calc): 184 mg/dL (calc) — ABNORMAL HIGH
Non-HDL Cholesterol (Calc): 203 mg/dL (calc) — ABNORMAL HIGH (ref <130)
Total CHOL/HDL Ratio: 3.1 (calc) (ref <5.0)
Triglycerides: 83 mg/dL (ref <150)

## 2017-09-19 LAB — COMPLETE METABOLIC PANEL WITH GFR
AG RATIO: 2.3 (calc) (ref 1.0–2.5)
ALKALINE PHOSPHATASE (APISO): 86 U/L (ref 40–115)
ALT: 66 U/L — ABNORMAL HIGH (ref 9–46)
AST: 51 U/L — AB (ref 10–35)
Albumin: 4.6 g/dL (ref 3.6–5.1)
BUN: 17 mg/dL (ref 7–25)
CHLORIDE: 102 mmol/L (ref 98–110)
CO2: 26 mmol/L (ref 20–32)
Calcium: 9.3 mg/dL (ref 8.6–10.3)
Creat: 0.74 mg/dL (ref 0.70–1.25)
GFR, Est African American: 115 mL/min/{1.73_m2} (ref >=60)
GFR, Est Non African American: 100 mL/min/{1.73_m2} (ref >=60)
Globulin: 2 g/dL (calc) (ref 1.9–3.7)
Glucose, Bld: 114 mg/dL (ref 65–139)
POTASSIUM: 4.5 mmol/L (ref 3.5–5.3)
SODIUM: 135 mmol/L (ref 135–146)
Total Bilirubin: 0.7 mg/dL (ref 0.2–1.2)
Total Protein: 6.6 g/dL (ref 6.1–8.1)

## 2017-09-25 ENCOUNTER — Telehealth: Payer: Self-pay | Admitting: Family Medicine

## 2017-09-25 MED ORDER — ROSUVASTATIN CALCIUM 20 MG PO TABS: 20.0000 mg | ORAL_TABLET | Freq: Every day | ORAL | 1 refills | Status: DC

## 2017-09-25 NOTE — Telephone Encounter (Signed)
Crestor (rosuvastatin) sent to pharmacy.  If you have significant muscle aches or pains with this medication please let me know.  This medication replaces pravastatin.

## 2017-09-25 NOTE — Telephone Encounter (Signed)
-----   Message from Pixie Casinohonda C Cunningham, [place] sent at 09/23/2017  1:32 PM EDT ----- Spoke to patient gave him results as noted below. [facility] [place] Please advise. Rhonda Cunningham,CMA

## 2017-09-25 NOTE — Telephone Encounter (Signed)
Patient has been informed and her verbally understands. Spencer Wang,CMA

## 2018-03-31 ENCOUNTER — Ambulatory Visit (INDEPENDENT_AMBULATORY_CARE_PROVIDER_SITE_OTHER): Payer: 59 | Admitting: Family Medicine

## 2018-03-31 ENCOUNTER — Encounter: Payer: Self-pay | Admitting: Family Medicine

## 2018-03-31 VITALS — BP 133/71 | HR 67 | Wt 201.0 lb

## 2018-03-31 DIAGNOSIS — Z789 Other specified health status: Secondary | ICD-10-CM

## 2018-03-31 DIAGNOSIS — G5712 Meralgia paresthetica, left lower limb: Secondary | ICD-10-CM | POA: Diagnosis not present

## 2018-03-31 DIAGNOSIS — E785 Hyperlipidemia, unspecified: Secondary | ICD-10-CM

## 2018-03-31 DIAGNOSIS — Z23 Encounter for immunization: Secondary | ICD-10-CM | POA: Diagnosis not present

## 2018-03-31 DIAGNOSIS — I10 Essential (primary) hypertension: Secondary | ICD-10-CM

## 2018-03-31 DIAGNOSIS — G4700 Insomnia, unspecified: Secondary | ICD-10-CM | POA: Diagnosis not present

## 2018-03-31 DIAGNOSIS — F418 Other specified anxiety disorders: Secondary | ICD-10-CM | POA: Diagnosis not present

## 2018-03-31 MED ORDER — PITAVASTATIN CALCIUM 4 MG PO TABS: 1.0000 | ORAL_TABLET | Freq: Every day | ORAL | 3 refills | Status: DC

## 2018-03-31 MED ORDER — LISINOPRIL 20 MG PO TABS: 20.0000 mg | ORAL_TABLET | Freq: Every day | ORAL | 1 refills | Status: DC

## 2018-03-31 MED ORDER — ZOLPIDEM TARTRATE 10 MG PO TABS: ORAL_TABLET | ORAL | 5 refills | Status: DC

## 2018-03-31 MED ORDER — ALPRAZOLAM 0.25 MG PO TABS: 0.2500 mg | ORAL_TABLET | Freq: Three times a day (TID) | ORAL | 3 refills | Status: DC | PRN

## 2018-03-31 NOTE — Progress Notes (Signed)
Spencer Wang is a 63 y.o. male who presents to [facility]: Primary Care Sports Medicine today for groin bulging, hypertension, hyperlipidemia.  Spencer Wang has a history of hernia repair over a year ago.  He notes a little bit of fullness or bulging in his right groin and is a little worried his hernia may have returned.  He denies any injury he notes it is not nearly as painful as it was previously.  Additionally he denies any bulging or swelling into the scrotum.  No change with urination.  No fevers chills nausea vomiting or diarrhea.  Symptoms present for over few weeks now.  Hypertension: Spencer Wang takes medications listed below.  No chest pain palpitations shortness of breath lightheadedness or dizziness.  Hyperlipidemia: Spencer Wang has hyperlipidemia that has not been well controlled with pravastatin.  He was switched to trial of rosuvastatin a few months ago.  He notes that he cannot tolerate 20 mg daily.  He has been taking it every few days and still having some muscle aches and pains despite lower dosage.  Additionally in the past has had trouble tolerating atorvastatin as well.  He additionally is working on a lower calorie diet and increased exercise in an effort to lose weight.  Additionally notes some burning sensation into the left lateral thigh.  This occurs with prolonged walking.  He denies any weakness or numbness.  Symptoms are typically mild.  ROS as above:  Exam:  BP 133/71   Pulse 67   Wt 201 lb (91.2 kg)   BMI 28.78 kg/m  Wt Readings from Last 5 Encounters:  03/31/18 201 lb (91.2 kg)  09/17/17 200 lb (90.7 kg)  04/10/17 202 lb (91.6 kg)  09/30/16 198 lb (89.8 kg)  04/29/16 201 lb (91.2 kg)    Gen: Well NAD HEENT: EOMI,  MMM Lungs: Normal work of breathing. CTABL Heart: RRR no MRG Abd: NABS, Soft. Nondistended, Nontender Exts: Brisk capillary refill, warm and well  perfused.  Genitals: No bulging present in groin.  No scrotal masses or bulging at inguinal ring.  Testicles descended bilaterally nontender with no masses palpated. MSK: Normal lower extremity motion and strength.  Normal gait.  Lab and Radiology Results Lab Results  Component Value Date   CHOL 300 (H) 09/19/2017   HDL 97 09/19/2017   LDLCALC 184 (H) 09/19/2017   TRIG 83 09/19/2017   CHOLHDL 3.1 09/19/2017      Assessment and Plan: 63 y.o. male with  Bulging: Doubtful for recurrent hernia.  Exam normal to my exam today.  Watchful waiting.  Hypertension: Blood pressure controlled.  Continue lisinopril.  Check metabolic panel in a few weeks.  Hyperlipidemia: Not controlled not tolerate conventional statins.  Plan to prescribe Livalo.  Recheck lipids with metabolic panel 6 weeks after starting Livalo.  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  Discussed lifestyle modification including tight pants and tight belt.  Weight loss will help as well.  BMI 28: Agree with reduced calorie diet and increased exercise.    Refill chronic medications as well for anxiety and insomnia.  Cautioned against Xanax and Ambien same time.  PDMP reviewed during this encounter. No orders of the defined types were placed in this encounter.  Meds ordered this encounter  Medications  . lisinopril (PRINIVIL,ZESTRIL) 20 MG tablet    Sig: Take 1 tablet (20 mg total) by mouth daily.    Dispense:  90 tablet    Refill:  1  . zolpidem (  AMBIEN) 10 MG tablet    Sig: TAKE 1 TABLET AT BEDTIME AS NEEDED SLEEP    Dispense:  30 tablet    Refill:  5    This request is for a new prescription for a controlled substance as required by Federal/State law. DX Code Needed  .  Marland Kitchen ALPRAZolam (XANAX) 0.25 MG tablet    Sig: Take 1 tablet (0.25 mg total) by mouth 3 (three) times daily as needed for anxiety.    Dispense:  60 tablet    Refill:  3  . DISCONTD: Pitavastatin Calcium 4 MG TABS    Sig: Take 1 tablet  (4 mg total) by mouth daily.    Dispense:  90 tablet    Refill:  3    Failed prior statin with intolerable side effects. Lipitor, Crestor, Pravastatin  . Pitavastatin Calcium 4 MG TABS    Sig: Take 1 tablet (4 mg total) by mouth daily.    Dispense:  90 tablet    Refill:  3    Failed prior statin with intolerable side effects. Lipitor, Crestor, Pravastatin     Historical information moved to improve visibility of documentation.  Past Medical History:  Diagnosis Date  . Hyperlipidemia   . Hypertension    Past Surgical History:  Procedure Laterality Date  . APPENDECTOMY     Social History   Tobacco Use  . Smoking status: Former Games developer  . Smokeless tobacco: Never Used  Substance Use Topics  . Alcohol use: Yes    Alcohol/week: 0.0 standard drinks   family history includes Hyperlipidemia in his father; Hypertension in his father.  Medications: Current Outpatient Medications  Medication Sig Dispense Refill  . ALPRAZolam (XANAX) 0.25 MG tablet Take 1 tablet (0.25 mg total) by mouth 3 (three) times daily as needed for anxiety. 60 tablet 3  . AMBULATORY NON FORMULARY MEDICATION Continuous positive airway pressure (CPAP) machine auto-titrate to a max pressure of 15 cm of H2O pressure, with all supplemental supplies as needed. 1 each 0  . diclofenac sodium (VOLTAREN) 1 % GEL Apply 4 g topically 4 (four) times daily. To affected joint. 100 g 11  . ipratropium (ATROVENT) 0.06 % nasal spray Place 2 sprays into both nostrils 4 (four) times daily. 15 mL 1  . lisinopril (PRINIVIL,ZESTRIL) 20 MG tablet Take 1 tablet (20 mg total) by mouth daily. 90 tablet 1  . zolpidem (AMBIEN) 10 MG tablet TAKE 1 TABLET AT BEDTIME AS NEEDED SLEEP 30 tablet 5  . Pitavastatin Calcium 4 MG TABS Take 1 tablet (4 mg total) by mouth daily. 90 tablet 3   No current facility-administered medications for this visit.    Allergies  Allergen Reactions  . Lipitor [Atorvastatin] Other (See Comments)    Myalgia      Discussed warning signs or symptoms. Please see discharge instructions. Patient expresses understanding.

## 2018-03-31 NOTE — Patient Instructions (Addendum)
Thank you for coming in today. Continue blood pressure medicine.  STOP Crestor.  START livalo.   I do not think you have a repeat hernia.   I think you have Meralgia Paresthetica.  This is a condition where the lateral femoral cutaneous nerve gets irritated.  Reduce belt tension Consider suspenders Work on weight loss  Plan to recheck labs after new cholesterol medicine in about 6 weeks or so.   Pitavastatin oral tablets What is this medicine? PITAVASTATIN (pit A va STAT in) is known as a HMG-CoA reductase inhibitor or 'statin'. It lowers the level of cholesterol and triglycerides in the blood. Diet and lifestyle changes are often used with this drug. This medicine may be used for other purposes; ask your health care provider or pharmacist if you have questions. COMMON BRAND NAME(S): Livalo, Zypitamag What should I tell my health care provider before I take this medicine? They need to know if you have any of these conditions: -diabetes -if you often drink alcohol -history of stroke -kidney disease -liver disease -muscle aches or weakness -thyroid disease -an unusual or allergic reaction to pitavastatin, other medicines, foods, dyes, or preservatives -pregnant or trying to get pregnant -breast-feeding How should I use this medicine? Take this medicine by mouth with a glass of water. Follow the directions on the prescription label. You can take it with or without food. If it upsets your stomach, take it with food. Take your medicine at regular intervals. Do not take it more often than directed. Do not stop taking except on your doctor's advice. Talk to your pediatrician regarding the use of this medicine in children. While this drug may be prescribed for children as young as 8 years for selected conditions, precautions do apply. Overdosage: If you think you have taken too much of this medicine contact a poison control center or emergency room at once. NOTE: This medicine is only for  you. Do not share this medicine with others. What if I miss a dose? If you miss a dose, take it as soon as you can. If it is almost time for your next dose, take only that dose. Do not take double or extra doses. What may interact with this medicine? Do not take this medicine with any of the following medications: - cyclosporine - gemfibrozil - herbal medicines like red yeast rice This medicine may also interact with the following medications: - alcohol - antiviral medicines for HIV or AIDS - erythromycin - other medicines for cholesterol - rifampin - warfarin This list may not describe all possible interactions. Give your health care provider a list of all the medicines, herbs, non-prescription drugs, or dietary supplements you use. Also tell them if you smoke, drink alcohol, or use illegal drugs. Some items may interact with your medicine. What should I watch for while using this medicine? Visit your doctor or health care professional for regular check-ups. You may need regular tests to make sure your liver is working properly. Your health care professional may tell you to stop taking this medicine if you develop muscle problems. If your muscle problems do not go away after stopping this medicine, contact your health care professional. Do not become pregnant while taking this medicine. Women should inform their health care professional if they wish to become pregnant or think they might be pregnant. There is a potential for serious side effects to an unborn child. Talk to your health care professional or pharmacist for more information. Do not breast-feed an infant while taking  this medicine. This medicine may affect blood sugar levels. If you have diabetes, check with your doctor or health care professional before you change your diet or the dose of your diabetic medicine. If you are going to need surgery or other procedure, tell your doctor that you are using this medicine. This drug is  only part of a total heart-health program. Your doctor or a dietician can suggest a low-cholesterol and low-fat diet to help. Avoid alcohol and smoking, and keep a proper exercise schedule. This medicine may cause a decrease in Co-Enzyme Q-10. You should make sure that you get enough Co-Enzyme Q-10 while you are taking this medicine. Discuss the foods you eat and the vitamins you take with your health care professional. What side effects may I notice from receiving this medicine? Side effects that you should report to your doctor or health care professional as soon as possible: - allergic reactions like skin rash, itching or hives, swelling of the face, lips, or tongue - dark urine - fever - joint pain - muscle cramps or pain - redness, blistering, peeling or loosening of the skin, including inside the mouth - trouble passing urine or change in the amount of urine - unusually weak or tired - yellowing of the eyes or skin Side effects that usually do not require medical attention (report to your doctor or health care professional if they continue or are bothersome): - constipation - heartburn - nausea This list may not describe all possible side effects. Call your doctor for medical advice about side effects. You may report side effects to FDA at 1-800-FDA-1088. Where should I keep my medicine? Keep out of the reach of children. Store at room temperature between 15 and 30 degrees C (59 and 86 degrees F). Protect from light. Throw away any unused medicine after the expiration date. NOTE: This sheet is a summary. It may not cover all possible information. If you have questions about this medicine, talk to your doctor, pharmacist, or health care provider.  2019 Elsevier/Gold Standard (2016-11-05 12:48:36)

## 2018-05-27 ENCOUNTER — Telehealth: Payer: Self-pay | Admitting: Family Medicine

## 2018-05-27 DIAGNOSIS — R740 Nonspecific elevation of levels of transaminase and lactic acid dehydrogenase [LDH]: Secondary | ICD-10-CM

## 2018-05-27 DIAGNOSIS — R7401 Elevation of levels of liver transaminase levels: Secondary | ICD-10-CM

## 2018-05-27 DIAGNOSIS — Z5181 Encounter for therapeutic drug level monitoring: Secondary | ICD-10-CM

## 2018-05-27 DIAGNOSIS — E785 Hyperlipidemia, unspecified: Secondary | ICD-10-CM

## 2018-05-27 NOTE — Telephone Encounter (Signed)
Fasting labs ordered to follow-up safety and efficacy of Livalo for cholesterol.  Labs to be done fasting.

## 2018-05-27 NOTE — Telephone Encounter (Signed)
Spoke with Pt to advise. He states insurance would not approve the livalo, so he has been taking the Atorvastatin 40mg . He will go get labs done to see how his levels are now.

## 2018-05-27 NOTE — Telephone Encounter (Signed)
-----   Message from Rodolph Bong, MD sent at 03/31/2018  8:21 PM EST ----- Regarding: Lipid and metabolic panel check Check lipids metabolic panel 6 weeks after starting Livalo

## 2018-07-05 ENCOUNTER — Other Ambulatory Visit: Payer: Self-pay | Admitting: Family Medicine

## 2018-07-05 DIAGNOSIS — I10 Essential (primary) hypertension: Secondary | ICD-10-CM

## 2018-10-03 ENCOUNTER — Other Ambulatory Visit: Payer: Self-pay | Admitting: Family Medicine

## 2018-10-22 ENCOUNTER — Encounter: Payer: Self-pay | Admitting: Family Medicine

## 2018-10-22 ENCOUNTER — Other Ambulatory Visit: Payer: Self-pay

## 2018-10-22 ENCOUNTER — Ambulatory Visit (INDEPENDENT_AMBULATORY_CARE_PROVIDER_SITE_OTHER): Payer: 59 | Admitting: Family Medicine

## 2018-10-22 VITALS — BP 110/72 | HR 62 | Temp 97.7°F | Wt 194.0 lb

## 2018-10-22 DIAGNOSIS — E785 Hyperlipidemia, unspecified: Secondary | ICD-10-CM

## 2018-10-22 DIAGNOSIS — F5101 Primary insomnia: Secondary | ICD-10-CM

## 2018-10-22 DIAGNOSIS — I10 Essential (primary) hypertension: Secondary | ICD-10-CM | POA: Diagnosis not present

## 2018-10-22 DIAGNOSIS — G4733 Obstructive sleep apnea (adult) (pediatric): Secondary | ICD-10-CM

## 2018-10-22 DIAGNOSIS — R351 Nocturia: Secondary | ICD-10-CM

## 2018-10-22 DIAGNOSIS — F418 Other specified anxiety disorders: Secondary | ICD-10-CM

## 2018-10-22 DIAGNOSIS — Z789 Other specified health status: Secondary | ICD-10-CM

## 2018-10-22 DIAGNOSIS — G4700 Insomnia, unspecified: Secondary | ICD-10-CM

## 2018-10-22 MED ORDER — ALPRAZOLAM 0.25 MG PO TABS: 0.2500 mg | ORAL_TABLET | Freq: Three times a day (TID) | ORAL | 3 refills | Status: DC | PRN

## 2018-10-22 MED ORDER — ROSUVASTATIN CALCIUM 20 MG PO TABS: 20.0000 mg | ORAL_TABLET | Freq: Every day | ORAL | 3 refills | Status: DC

## 2018-10-22 MED ORDER — ZOLPIDEM TARTRATE 10 MG PO TABS: ORAL_TABLET | ORAL | 5 refills | Status: DC

## 2018-10-22 MED ORDER — TAMSULOSIN HCL 0.4 MG PO CAPS: 0.4000 mg | ORAL_CAPSULE | Freq: Every day | ORAL | 3 refills | Status: DC

## 2018-10-22 MED ORDER — LISINOPRIL 20 MG PO TABS: 20.0000 mg | ORAL_TABLET | Freq: Every day | ORAL | 3 refills | Status: DC

## 2018-10-22 NOTE — Progress Notes (Signed)
Spencer Wang is a 63 y.o. male who [facility] Primary [facility] Medicine today for nocturia, mild abdominal pain, toe injury, and med follow-up.  Nocturia: Patient reports waking multiple times every other night to urinate over the last month. Notes weak urine stream and sensation of incomplete voiding. No pain with urination  Abdominal pain: Patient also reports a several month Hx of RLQ abdominal pain and a feeling of bulging with physical activity such as hiking. Patient had a right inguinal hernia surgical correction last year.  Toe Injury: Patient bruised his right great toe while doing work around the house last week. He has continued pain and bruising around the nail and discoloration of the nail.  Meds: Patient notes that he is anxious from work and is having trouble sleeping. He does not use his CPAP every night for OSA although he notes that he feels like perhaps he should. He takes Ambien some nights and says it helps. Patient reports that the Pitavastatin was not covered by insurance so he took Rosuvastatin instead. He ran out 1 week ago.   ROS as above:  Exam:  BP 110/72   Pulse 62   Temp 97.7 F (36.5 C) (Oral)   Wt 194 lb (88 kg)   BMI 27.77 kg/m  Wt Readings from Last 5 Encounters:  10/22/18 194 lb (88 kg)  03/31/18 201 lb (91.2 kg)  09/17/17 200 lb (90.7 kg)  04/10/17 202 lb (91.6 kg)  09/30/16 198 lb (89.8 kg)    Gen: Well NAD HEENT: EOMI,  MMM Lungs: Normal work of breathing. CTABL Heart: RRR no MRG Abd: NABS, Soft. Nondistended, Nontender GU: No mass bulging through spermatic cords BL with increased intrathoracic pressure. Exts: Bruising, redness, and tender to palpation around base of right great toe nail. Discoloration and tenderness of right great toe nail. Brisk capillary refill, warm and well perfused.   Rectal: Normal appearing anus. No  masses or ulcers palpated. Uniformly enlarged prostate. No nodules or masses palpated.  Actively tender to palpation.  Lab and Radiology Results No results found for this or any previous visit (from the past 72 hour(s)). No results found.    Assessment and Plan: 63 y.o. male with Hx of anxiety and insomnia managed with Ambien and Xanax, OSA, and a right inguinal hernia repair last year is here for abd pain, nocturia, a right great toe injury, and med follow-up.  Nocturia: Patient reports waking multiple times every other night to urinate with weak urine stream and sensation of incomplete voiding. Prostate is uniformly enlarged on rectal exam suggesting BPH. Prescribed Tamsulosin to aid urination. Ordered PSA.  Abdominal pain: Patient reports a several month Hx of RLQ abdominal pain and a feeling of bulging with physical activity such as hiking. Patient had a right inguinal hernia surgical correction last year. If symptoms bother him significantly, consider PT or follow-up with his surgeon.  Toe Injury: Patient bruised his right great toe while doing work around the house. He has pain and bruising around the nail and discoloration of the nail. Counseled that his toe will continue to improve although he may lose the nail at some point.  Meds: Patient notes that he is anxious from work and is having trouble sleeping. He does not use his CPAP every night for OSA. Counseled that regular CPAP use will improve his quality of sleep. He takes Ambien some nights and says it still helps. Patient switched to Rosuvastatin due  to insurance. I issued refills. Ordered lipid panel, CMP, and CBC.  Follow-up in 6 months   PDMP reviewed during this encounter. Orders Placed This Encounter  Procedures  . CBC  . COMPLETE METABOLIC PANEL WITH GFR  . Lipid Panel w/reflex Direct LDL  . PSA   Meds ordered this encounter  Medications  . rosuvastatin (CRESTOR) 20 MG tablet    Sig: Take 1 tablet (20 mg total) by  mouth daily.    Dispense:  90 tablet    Refill:  3  . lisinopril (ZESTRIL) 20 MG tablet    Sig: Take 1 tablet (20 mg total) by mouth daily.    Dispense:  90 tablet    Refill:  3  . ALPRAZolam (XANAX) 0.25 MG tablet    Sig: Take 1 tablet (0.25 mg total) by mouth 3 (three) times daily as needed for anxiety.    Dispense:  60 tablet    Refill:  3  . zolpidem (AMBIEN) 10 MG tablet    Sig: TAKE 1 TABLET AT BEDTIME AS NEEDED SLEEP    Dispense:  30 tablet    Refill:  5    This request is for a new prescription for a controlled substance as required by Federal/State law. DX Code Needed  .  . tamsulosin (FLOMAX) 0.4 MG CAPS capsule    Sig: Take 1 capsule (0.4 mg total) by mouth daily.    Dispense:  90 capsule    Refill:  3     Historical information moved to improve visibility of documentation.  Past Medical History:  Diagnosis Date  . Hyperlipidemia   . Hypertension    Past Surgical History:  Procedure Laterality Date  . APPENDECTOMY     Social History   Tobacco Use  . Smoking status: Former Smoker    Quit date: 03/19/1995    Years since quitting: 23.6  . Smokeless tobacco: Never Used  Substance Use Topics  . Alcohol use: Yes    Alcohol/week: 0.0 standard drinks   family history includes Hyperlipidemia in his father; Hypertension in his father.  Medications: Current Outpatient Medications  Medication Sig Dispense Refill  . ALPRAZolam (XANAX) 0.25 MG tablet Take 1 tablet (0.25 mg total) by mouth 3 (three) times daily as needed for anxiety. 60 tablet 3  . AMBULATORY NON FORMULARY MEDICATION Continuous positive airway pressure (CPAP) machine auto-titrate to a max pressure of 15 cm of H2O pressure, with all supplemental supplies as needed. 1 each 0  . diclofenac sodium (VOLTAREN) 1 % GEL Apply 4 g topically 4 (four) times daily. To affected joint. 100 g 11  . ipratropium (ATROVENT) 0.06 % nasal spray Place 2 sprays into both nostrils 4 (four) times daily. 15 mL 1  . lisinopril  (ZESTRIL) 20 MG tablet Take 1 tablet (20 mg total) by mouth daily. 90 tablet 3  . zolpidem (AMBIEN) 10 MG tablet TAKE 1 TABLET AT BEDTIME AS NEEDED SLEEP 30 tablet 5  . rosuvastatin (CRESTOR) 20 MG tablet Take 1 tablet (20 mg total) by mouth daily. 90 tablet 3  . tamsulosin (FLOMAX) 0.4 MG CAPS capsule Take 1 capsule (0.4 mg total) by mouth daily. 90 capsule 3   No current facility-administered medications for this visit.    Allergies  Allergen Reactions  . Lipitor [Atorvastatin] Other (See Comments)    Myalgia     Discussed warning signs or symptoms. Please see discharge instructions. Patient expresses understanding.  I personally was present and performed or re-performed the history, physical  exam and medical decision-making activities of this service and have verified that the service and findings are accurately documented in the student's note. ___________________________________________ Clementeen GrahamEvan Corey M.D., ABFM., CAQSM. Primary Care and Sports Medicine Adjunct Instructor of Family Medicine  [employer]

## 2018-10-22 NOTE — Progress Notes (Signed)
Note duplication 

## 2018-10-22 NOTE — Patient Instructions (Addendum)
Thank you for coming in today. Take tamsulosin daily for prostate. This should help the urination especially at night.  Continue current medicines.  Call or go to the emergency room if you get worse, have trouble breathing, have chest pains, or palpitations.   Get labs today.  Recheck in 6 months.   Tamsulosin capsules What is this medicine? TAMSULOSIN (tam SOO loe sin) is an alpha blocker. It is used to treat the signs and symptoms of an enlarged prostate in men. This condition is also called benign prostatic hyperplasia (BPH). This medicine may be used for other purposes; ask your health care provider or pharmacist if you have questions. COMMON BRAND NAME(S): Flomax What should I tell my health care provider before I take this medicine? They need to know if you have any of the following conditions:  advanced kidney disease  advanced liver disease  low blood pressure  prostate cancer  an unusual or allergic reaction to tamsulosin, sulfa drugs, other medicines, foods, dyes, or preservatives  pregnant or trying to get pregnant  breast-feeding How should I use this medicine? Take this medicine by mouth about 30 minutes after the same meal every day. Follow the directions on the prescription label. Swallow the capsules whole with a glass of water. Do not crush, chew, or open capsules. Do not take your medicine more often than directed. Do not stop taking your medicine unless your doctor tells you to. Talk to your pediatrician regarding the use of this medicine in children. Special care may be needed. Overdosage: If you think you have taken too much of this medicine contact a poison control center or emergency room at once. NOTE: This medicine is only for you. Do not share this medicine with others. What if I miss a dose? If you miss a dose, take it as soon as you can. If it is almost time for your next dose, take only that dose. Do not take double or extra doses. If you stop taking  your medicine for several days or more, ask your doctor or health care professional what dose you should start back on. What may interact with this medicine?  cimetidine  fluoxetine  ketoconazole  medicines for erectile disfunction like sildenafil, tadalafil, vardenafil  medicines for high blood pressure  other alpha-blockers like alfuzosin, doxazosin, phentolamine, phenoxybenzamine, prazosin, terazosin  warfarin This list may not describe all possible interactions. Give your health care provider a list of all the medicines, herbs, non-prescription drugs, or dietary supplements you use. Also tell them if you smoke, drink alcohol, or use illegal drugs. Some items may interact with your medicine. What should I watch for while using this medicine? Visit your doctor or health care professional for regular check ups. You will need lab work done before you start this medicine and regularly while you are taking it. Check your blood pressure as directed. Ask your health care professional what your blood pressure should be, and when you should contact him or her. This medicine may make you feel dizzy or lightheaded. This is more likely to happen after the first dose, after an increase in dose, or during hot weather or exercise. Drinking alcohol and taking some medicines can make this worse. Do not drive, use machinery, or do anything that needs mental alertness until you know how this medicine affects you. Do not sit or stand up quickly. If you begin to feel dizzy, sit down until you feel better. These effects can decrease once your body adjusts to the  medicine. Contact your doctor or health care professional right away if you have an erection that lasts longer than 4 hours or if it becomes painful. This may be a sign of a serious problem and must be treated right away to prevent permanent damage. If you are thinking of having cataract surgery, tell your eye surgeon that you have taken this  medicine. What side effects may I notice from receiving this medicine? Side effects that you should report to your doctor or health care professional as soon as possible:  allergic reactions like skin rash or itching, hives, swelling of the lips, mouth, tongue, or throat  breathing problems  change in vision  feeling faint or lightheaded  irregular heartbeat  prolonged or painful erection  weakness Side effects that usually do not require medical attention (report to your doctor or health care professional if they continue or are bothersome):  back pain  change in sex drive or performance  constipation, nausea or vomiting  cough  drowsy  runny or stuffy nose  trouble sleeping This list may not describe all possible side effects. Call your doctor for medical advice about side effects. You may report side effects to FDA at 1-800-FDA-1088. Where should I keep my medicine? Keep out of the reach of children. Store at room temperature between 15 and 30 degrees C (59 and 86 degrees F). Throw away any unused medicine after the expiration date. NOTE: This sheet is a summary. It may not cover all possible information. If you have questions about this medicine, talk to your doctor, pharmacist, or health care provider.  2020 Elsevier/Gold Standard (2017-08-07 12:54:06)

## 2018-10-23 LAB — CBC
HCT: 38.9 % (ref 38.5–50.0)
Hemoglobin: 13.3 g/dL (ref 13.2–17.1)
MCH: 31.3 pg (ref 27.0–33.0)
MCHC: 34.2 g/dL (ref 32.0–36.0)
MCV: 91.5 fL (ref 80.0–100.0)
MPV: 9.4 fL (ref 7.5–12.5)
Platelets: 247 10*3/uL (ref 140–400)
RBC: 4.25 10*6/uL (ref 4.20–5.80)
RDW: 12.6 % (ref 11.0–15.0)
WBC: 5.5 10*3/uL (ref 3.8–10.8)

## 2018-10-23 LAB — LIPID PANEL W/REFLEX DIRECT LDL
Cholesterol: 265 mg/dL — ABNORMAL HIGH (ref <200)
HDL: 55 mg/dL (ref >=40)
LDL Cholesterol (Calc): 181 mg/dL (calc) — ABNORMAL HIGH
Non-HDL Cholesterol (Calc): 210 mg/dL (calc) — ABNORMAL HIGH (ref <130)
Total CHOL/HDL Ratio: 4.8 (calc) (ref <5.0)
Triglycerides: 145 mg/dL (ref <150)

## 2018-10-23 LAB — COMPLETE METABOLIC PANEL WITH GFR
AG Ratio: 2.1 (calc) (ref 1.0–2.5)
ALT: 25 U/L (ref 9–46)
AST: 20 U/L (ref 10–35)
Albumin: 4.4 g/dL (ref 3.6–5.1)
Alkaline phosphatase (APISO): 43 U/L (ref 35–144)
BUN: 18 mg/dL (ref 7–25)
CO2: 30 mmol/L (ref 20–32)
Calcium: 9.5 mg/dL (ref 8.6–10.3)
Chloride: 103 mmol/L (ref 98–110)
Creat: 0.9 mg/dL (ref 0.70–1.25)
GFR, Est African American: 106 mL/min/{1.73_m2} (ref >=60)
GFR, Est Non African American: 91 mL/min/{1.73_m2} (ref >=60)
Globulin: 2.1 g/dL (calc) (ref 1.9–3.7)
Glucose, Bld: 110 mg/dL — ABNORMAL HIGH (ref 65–99)
Potassium: 4.3 mmol/L (ref 3.5–5.3)
Sodium: 138 mmol/L (ref 135–146)
Total Bilirubin: 0.7 mg/dL (ref 0.2–1.2)
Total Protein: 6.5 g/dL (ref 6.1–8.1)

## 2018-10-23 LAB — PSA: PSA: 2.7 ng/mL (ref <=4.0)

## 2018-12-10 ENCOUNTER — Other Ambulatory Visit: Payer: Self-pay

## 2018-12-10 ENCOUNTER — Ambulatory Visit (INDEPENDENT_AMBULATORY_CARE_PROVIDER_SITE_OTHER): Payer: 59 | Admitting: Family Medicine

## 2018-12-10 DIAGNOSIS — Z23 Encounter for immunization: Secondary | ICD-10-CM

## 2019-03-01 ENCOUNTER — Encounter: Payer: Self-pay | Admitting: Sports Medicine

## 2019-03-01 ENCOUNTER — Other Ambulatory Visit: Payer: Self-pay

## 2019-03-01 ENCOUNTER — Ambulatory Visit (INDEPENDENT_AMBULATORY_CARE_PROVIDER_SITE_OTHER): Payer: 59 | Admitting: Sports Medicine

## 2019-03-01 ENCOUNTER — Ambulatory Visit (INDEPENDENT_AMBULATORY_CARE_PROVIDER_SITE_OTHER): Payer: 59

## 2019-03-01 DIAGNOSIS — R519 Headache, unspecified: Secondary | ICD-10-CM | POA: Diagnosis not present

## 2019-03-01 DIAGNOSIS — M7542 Impingement syndrome of left shoulder: Secondary | ICD-10-CM

## 2019-03-01 DIAGNOSIS — G8929 Other chronic pain: Secondary | ICD-10-CM

## 2019-03-01 MED ORDER — MELOXICAM 15 MG PO TABS: ORAL_TABLET | ORAL | 3 refills | Status: DC

## 2019-03-01 NOTE — Progress Notes (Signed)
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  In addition he has had a headache for many months now, right-sided, throbbing, he can hear a pulsating sound.  I reviewed the past medical history, family history, social history, surgical history, and allergies today and no changes were needed.  Please see the problem list section below in epic for further details.  Past Medical History: Past Medical History:  Diagnosis Date  . Hyperlipidemia   . Hypertension    Past Surgical History: Past Surgical History:  Procedure Laterality Date  . APPENDECTOMY     Social History: Social History   Socioeconomic History  . Marital status: Married    Spouse name: Not on file  . Number of children: Not on file  . Years of education: Not on file  . Highest education level: Not on file  Occupational History  . Not on file  Tobacco Use  . Smoking status: Former Smoker    Quit date: 03/19/1995    Years since quitting: 23.9  . Smokeless tobacco: Never Used  Substance and Sexual Activity  . Alcohol use: Yes    Alcohol/week: 0.0 standard drinks  . Drug use: No  . Sexual activity: Yes    Partners: Female  Other Topics Concern  . Not on file  Social History Narrative  . Not on file   Social Determinants of Health   Financial Resource Strain:   . Difficulty of Paying Living Expenses: Not on file  Food Insecurity:   . Worried About Charity fundraiser in the Last Year: Not on file  . Ran Out of Food in the Last Year: Not on file  Transportation Needs:   . Lack of Transportation (Medical): Not on file  . Lack of Transportation (Non-Medical): Not on file  Physical Activity:   . Days of Exercise per Week: Not on file  . Minutes of Exercise per Session: Not on file  Stress:   .  Feeling of Stress : Not on file  Social Connections:   . Frequency of Communication with Friends and Family: Not on file  . Frequency of Social Gatherings with Friends and Family: Not on file  . Attends Religious Services: Not on file  . Active Member of Clubs or Organizations: Not on file  . Attends Archivist Meetings: Not on file  . Marital Status: Not on file   Family History: Family History  Problem Relation Age of Onset  . Hypertension Father   . Hyperlipidemia Father    Allergies: Allergies  Allergen Reactions  . Lipitor [Atorvastatin] Other (See Comments)    Myalgia   Medications: See med rec.  Review of Systems: No fevers, chills, night sweats, weight loss, chest pain, or shortness of breath.   Objective:    General: Well Developed, well nourished, and in no acute distress.  Neuro: Alert and oriented x3, extra-ocular muscles intact, sensation grossly intact.  Cranial nerves II through XII are intact, motor, sensory, coordinative functions are all intact. HEENT: Normocephalic, atraumatic, pupils equal round reactive to light, neck supple, no masses, no lymphadenopathy, thyroid nonpalpable.  Oropharynx, nasopharynx, ear canals unremarkable. Skin: Warm and dry, no rashes. Cardiac: Regular rate and rhythm, no murmurs rubs or gallops, no lower extremity edema.  Respiratory: Clear to auscultation bilaterally. Not using accessory  muscles, speaking in full sentences. Left shoulder: Inspection reveals no abnormalities, atrophy or asymmetry. Palpation is normal with no tenderness over AC joint or bicipital groove. ROM is full in all planes. Rotator cuff strength weak to abduction and internal rotation with reproduction of pain. Positive Neer and Hawkin's tests, empty can. Speeds and Yergason's tests normal. No labral pathology noted with negative Obrien's, negative crank, negative clunk, and good stability. Normal scapular function observed. No painful arc and no  drop arm sign. No apprehension sign  Impression and Recommendations:    Impingement syndrome, shoulder, left Impingement syndrome, meloxicam, x-rays, formal PT. Return in 6 weeks, injection if no better, we did discuss the anatomy and pathophysiology of impingement syndrome.  Chronic headaches Chronic right-sided pulsatile headaches. No photophobia, mild photophobia, mild nausea. Considering the pulsatile nature we are going to proceed with MRI of the brain with and without contrast, adding renal function, we can probably do the MRI tomorrow.   ___________________________________________ Ihor Austin. Benjamin Stain, M.D., ABFM., CAQSM. Primary Care and Sports Medicine [facility]  Adjunct Professor of Family Medicine  [employer]

## 2019-03-01 NOTE — Assessment & Plan Note (Signed)
Chronic right-sided pulsatile headaches. No photophobia, mild photophobia, mild nausea. Considering the pulsatile nature we are going to proceed with MRI of the brain with and without contrast, adding renal function, we can probably do the MRI tomorrow.

## 2019-03-01 NOTE — Assessment & Plan Note (Signed)
Impingement syndrome, meloxicam, x-rays, formal PT. Return in 6 weeks, injection if no better, we did discuss the anatomy and pathophysiology of impingement syndrome.

## 2019-03-02 ENCOUNTER — Ambulatory Visit (INDEPENDENT_AMBULATORY_CARE_PROVIDER_SITE_OTHER): Payer: 59

## 2019-03-02 DIAGNOSIS — G8929 Other chronic pain: Secondary | ICD-10-CM | POA: Diagnosis not present

## 2019-03-02 DIAGNOSIS — R519 Headache, unspecified: Secondary | ICD-10-CM

## 2019-03-02 LAB — BASIC METABOLIC PANEL WITH GFR
BUN: 17 mg/dL (ref 7–25)
CO2: 28 mmol/L (ref 20–32)
Calcium: 9.3 mg/dL (ref 8.6–10.3)
Chloride: 102 mmol/L (ref 98–110)
Creat: 0.77 mg/dL (ref 0.70–1.25)
GFR, Est African American: 112 mL/min/{1.73_m2} (ref >=60)
GFR, Est Non African American: 97 mL/min/{1.73_m2} (ref >=60)
Glucose, Bld: 111 mg/dL — ABNORMAL HIGH (ref 65–99)
Potassium: 4.6 mmol/L (ref 3.5–5.3)
Sodium: 138 mmol/L (ref 135–146)

## 2019-03-02 MED ORDER — GADOBUTROL 1 MMOL/ML IV SOLN
9.0000 mL | Freq: Once | INTRAVENOUS | Status: AC | PRN
  Administered 2019-03-02: 11:00:00 9 mL via INTRAVENOUS

## 2019-03-09 ENCOUNTER — Other Ambulatory Visit: Payer: Self-pay

## 2019-03-09 ENCOUNTER — Ambulatory Visit (INDEPENDENT_AMBULATORY_CARE_PROVIDER_SITE_OTHER): Payer: 59 | Admitting: Physical Therapy

## 2019-03-09 DIAGNOSIS — M25512 Pain in left shoulder: Secondary | ICD-10-CM | POA: Diagnosis not present

## 2019-03-09 DIAGNOSIS — M25612 Stiffness of left shoulder, not elsewhere classified: Secondary | ICD-10-CM

## 2019-03-09 NOTE — Therapy (Addendum)
[place], [place], [address] Phone: [phone]   Fax:  [phone]  Physical Therapy Evaluation/Discharge Summary  Patient Details  Name: Spencer Wang MRN: [id number] Date of Birth: 07/30/1955 Referring Provider (PT): Thekkekandam    Encounter Date: 03/09/2019  PT End of Session - 03/09/19 0814    Visit Number  1    Number of Visits  13    Date for PT Re-Evaluation  05/05/19    PT Start Time  0803    PT Stop Time  0843    PT Time Calculation (min)  40 min    Activity Tolerance  Patient tolerated treatment well    Behavior During Therapy  [facility] for tasks assessed/performed       Past Medical History:  Diagnosis Date  . Hyperlipidemia   . Hypertension     Past Surgical History:  Procedure Laterality Date  . APPENDECTOMY      There were no vitals filed for this visit.   Subjective Assessment - 03/09/19 0809    Subjective  Pt presenting with L shoulder pain that began about few months ago when he was hiking and holding onto rocks using his L UE.    Limitations  Lifting    Diagnostic tests  X-ray    Patient Stated Goals  Be able to reach without pain, do activities of daily living without pain    Currently in Pain?  Yes    Pain Score  5     Pain Location  Shoulder    Pain Orientation  Left    Pain Descriptors / Indicators  Aching    Pain Type  Acute pain    Pain Radiating Towards  radiates into shoulder    Pain Onset  More than a month ago    Pain Frequency  Intermittent    Aggravating Factors   reaching overhead    Pain Relieving Factors  resting    Effect of Pain on Daily Activities  difficulty completing overhead tasks         [facility] PT Assessment - 03/09/19 0001      Assessment   Medical Diagnosis  M75.42 Impingment syndrome - left    Referring Provider (PT)  Thekkekandam     Onset Date/Surgical Date  --   months ago   Hand Dominance  Right    Prior Therapy  no       Precautions   Precautions  None      Restrictions   Weight Bearing Restrictions  No      Balance Screen   Has the patient fallen in the past 6 months  No    Is the patient reluctant to leave their home because of a fear of falling?   No      Home Environment   Living Environment  Private residence    Living Arrangements  Spouse/significant other    Available Help at Discharge  Family    Type of [place] to enter    Entrance Stairs-Number of Steps  2    [place]  Two level    Alternate Level Stairs-Number of Steps  15    Alternate Level Stairs-Rails  Right      Prior Function   Level of Independence  Independent    Vocation  Full time employment    Vocation Requirements  [employer]    Leisure  hike, reading,  kyaking      Cognition   Overall Cognitive Status  Within Functional Limits for tasks assessed      Observation/Other Assessments   Focus on Therapeutic Outcomes (FOTO)   43% limitation      Posture/Postural Control   Posture/Postural Control  Postural limitations    Postural Limitations  Rounded Shoulders;Forward head      ROM / Strength   AROM / PROM / Strength  AROM;Strength      AROM   Overall AROM   Deficits    AROM Assessment Site  Shoulder    Right/Left Shoulder  Right;Left    Right Shoulder Extension  50 Degrees    Right Shoulder Flexion  160 Degrees    Right Shoulder Internal Rotation  --   thumb to T5   Right Shoulder External Rotation  58 Degrees    Right Shoulder Horizontal ABduction  142 Degrees    Left Shoulder Extension  42 Degrees    Left Shoulder Flexion  140 Degrees    Left Shoulder ABduction  96 Degrees    Left Shoulder Internal Rotation  --   thumb to T10   Left Shoulder External Rotation  36 Degrees      Strength   Overall Strength  Deficits    Overall Strength Comments  MMT limited by pain in L shoulder    Strength Assessment Site  Shoulder    Right/Left Shoulder  Right;Left     Right Shoulder Flexion  5/5    Right Shoulder Extension  5/5    Right Shoulder Internal Rotation  5/5    Right Shoulder External Rotation  5/5    Left Shoulder Flexion  3/5    Left Shoulder Extension  3/5    Left Shoulder Internal Rotation  3/5    Left Shoulder External Rotation  3/5                Objective measurements completed on examination: See above findings.              PT Education - 03/09/19 0957    Education Details  HEP was emailed to pt with his permission, PT POC    Person(s) Educated  Patient    Methods  Explanation;Demonstration;Other (comment)    Comprehension  Verbalized understanding;Returned demonstration       PT Short Term Goals - 03/09/19 0816      PT SHORT TERM GOAL #1   Title  Pt will be independent in his HEP.    Time  3    Period  Weeks    Status  New    Target Date  05/04/19        PT Long Term Goals - 03/09/19 1007      PT LONG TERM GOAL #1   Title  pt will be independent in his HEP and progression.    Time  6    Period  Weeks    Status  New    Target Date  04/21/19      PT LONG TERM GOAL #2   Title  Pt will be able to reach overhead to hang clothes in his closet and retrieve them with no pain reported.    Baseline  difficuty reaching overhead and increased pain noted.    Time  6    Period  Weeks    Status  New    Target Date  04/21/19      PT LONG TERM GOAL #3   Title  Pt will improve ER in his L shoulder >/= 55 degrees in order to improve function.    Baseline  ER left: 36 with incresed pain    Time  6    Period  Weeks    Status  New    Target Date  04/21/19      PT LONG TERM GOAL #4   Title  Pt will be able to lift 10 # from floor to counter height with no pain using correct body mechanics.    Time  6    Period  Weeks    Status  New    Target Date  04/21/19             Plan - 03/09/19 0845    Clinical Impression Statement  Pt presenting today with L shoulder pain which began a few months ago  after going hiking and using his arms to hold onto rocks for balance. Pt with dx of impingment syndrome. Pt presenting with limited ROM in his L shoulder compared to his R with pain in all movements. Pt also with weakness in left shoulder limted by pain with MMT. Pt with tenderness to palpation in L upper trap, levator and anterior shoulder. Pt could benefit from skilled PT to progress toward his PLOF with the below interventions.    Personal Factors and Comorbidities  Comorbidity 2    Comorbidities  arthritis, allergies, HTH, hyperlipidemia, current dx L shoulder impingement    Examination-Activity Limitations  Carry;Reach Overhead;Other;Dressing    Examination-Participation Restrictions  Yard Work;Community Activity;Other    Stability/Clinical Decision Making  Stable/Uncomplicated    Clinical Decision Making  Low    Rehab Potential  Good    PT Frequency  2x / week    PT Duration  6 weeks    PT Treatment/Interventions  ADLs/Self Care Home Management;Electrical Stimulation;Iontophoresis 108m/ml Dexamethasone;Moist Heat;Ultrasound;Cryotherapy;Functional mobility training;Stair training;Neuromuscular re-education;Therapeutic exercise;Balance training;Therapeutic activities;Gait training;Patient/family education;Passive range of motion;Dry needling;Taping;Manual techniques    PT Next Visit Plan  shoulder stretching, ROM, gentle strengthening, STM, E-stim/US combo, DN at futher visit    PT Home Exercise Plan  Access Code: [email or web address]    Consulted and Agree with Plan of Care  Patient       Patient will benefit from skilled therapeutic intervention in order to improve the following deficits and impairments:  Pain, Postural dysfunction, Decreased range of motion, Decreased strength, Impaired UE functional use  Visit Diagnosis: Acute pain of left shoulder  Stiffness of left shoulder, not elsewhere classified     Problem List Patient Active Problem List   Diagnosis Date Noted  . Impingement  syndrome, shoulder, left 03/01/2019  . Chronic headaches 03/01/2019  . Rash and nonspecific skin eruption 05/11/2015  . Sessile colonic polyp 04/18/2015  . IFG (impaired fasting glucose) 02/27/2015  . Essential hypertension 02/27/2015  . Peripheral neuropathy, idiopathic 02/17/2015  . Vitamin D deficiency 01/24/2015  . Situational anxiety 01/23/2015  . Insomnia 01/23/2015  . Dyslipidemia 01/23/2015  . Statin intolerance 01/23/2015  . Seasonal allergies 01/23/2015  . OSA (obstructive sleep apnea) 01/23/2015   PHYSICAL THERAPY DISCHARGE SUMMARY  Visits from Start of Care: 1  Current functional level related to goals / functional outcomes: see evaluation   Remaining deficits: No goals met, pt only came for initial evaluation   Education / Equipment: HEP Plan:  Patient goals were not met. Patient is being discharged due to not returning since the last visit.  ?????      Oretha Caprice, PT 03/09/2019, 10:10 AM  [facility] [place], [place], [address] Phone: [phone]   Fax:  [phone]  Name: Treylan Mcclintock MRN: [id number] Date of Birth: 09-02-1955

## 2019-03-15 ENCOUNTER — Encounter: Payer: 59 | Admitting: Physical Therapy

## 2019-03-22 ENCOUNTER — Encounter: Payer: 59 | Admitting: Physical Therapy

## 2019-03-23 ENCOUNTER — Ambulatory Visit (INDEPENDENT_AMBULATORY_CARE_PROVIDER_SITE_OTHER): Payer: 59 | Admitting: Sports Medicine

## 2019-03-23 ENCOUNTER — Encounter: Payer: Self-pay | Admitting: Sports Medicine

## 2019-03-23 DIAGNOSIS — R52 Pain, unspecified: Secondary | ICD-10-CM | POA: Diagnosis not present

## 2019-03-23 DIAGNOSIS — J029 Acute pharyngitis, unspecified: Secondary | ICD-10-CM

## 2019-03-23 DIAGNOSIS — I10 Essential (primary) hypertension: Secondary | ICD-10-CM

## 2019-03-23 DIAGNOSIS — R05 Cough: Secondary | ICD-10-CM

## 2019-03-23 DIAGNOSIS — R509 Fever, unspecified: Secondary | ICD-10-CM

## 2019-03-23 DIAGNOSIS — B349 Viral infection, unspecified: Secondary | ICD-10-CM | POA: Insufficient documentation

## 2019-03-23 MED ORDER — VALSARTAN 320 MG PO TABS: 320.0000 mg | ORAL_TABLET | Freq: Every day | ORAL | 3 refills | Status: DC

## 2019-03-23 NOTE — Addendum Note (Signed)
Addended by: Jed Limerick on: 03/23/2019 09:57 AM   Modules accepted: Orders

## 2019-03-23 NOTE — Assessment & Plan Note (Signed)
Pleasant 63 year old male has had low-grade fevers, sore throat, cough, achiness for the past few days. He will come in for a drive up [id number] swab and quarantine for 14 days.

## 2019-03-23 NOTE — Progress Notes (Signed)
   Virtual Visit via WebEx/MyChart   I connected with  Tracen Cerrato  on 03/23/19 via WebEx/MyChart/Doximity Video and verified that I am speaking with the correct person using two identifiers.   I discussed the limitations, risks, security and privacy concerns of performing an evaluation and management service by WebEx/MyChart/Doximity Video, including the higher likelihood of inaccurate diagnosis and treatment, and the availability of in person appointments.  We also discussed the likely need of an additional face to face encounter for complete and high quality delivery of care.  I also discussed with the patient that there may be a patient responsible charge related to this service. The patient expressed understanding and wishes to proceed.  Provider location is either at home or medical facility. Patient location is at their home, different from provider location. People involved in care of the patient during this telehealth encounter were myself, my nurse/medical assistant, and my front office/scheduling team member.  Review of Systems: No fevers, chills, night sweats, weight loss, chest pain, or shortness of breath.   Objective Findings:    General: Speaking full sentences, no audible heavy breathing.  Sounds alert and appropriately interactive.  Appears well.  Face symmetric.  Extraocular movements intact.  Pupils equal and round.  No nasal flaring or accessory muscle use visualized.  Independent interpretation of tests performed by another provider:   None.  Impression and Recommendations:    Viral syndrome Pleasant 64 year old male has had low-grade fevers, sore throat, cough, achiness for the past few days. He will come in for a drive up [id number] swab and quarantine for 14 days.  Essential hypertension Has developed a dry cough with lisinopril,Unsure if this is related to a viral syndrome or his ACE inhibitor but we are going to stop the lisinopril, and switch to valsartan  320, his blood pressure was uncontrolled anyway. He was having some headaches and neck pain. No chest pain, shortness of breath. 2-week follow-up to reevaluate blood pressure.   I discussed the above assessment and treatment plan with the patient. The patient was provided an opportunity to ask questions and all were answered. The patient agreed with the plan and demonstrated an understanding of the instructions.   The patient was advised to call back or seek an in-person evaluation if the symptoms worsen or if the condition fails to improve as anticipated.   I provided 30 minutes of face to face and non-face-to-face time during this encounter date, time was needed to gather information, review chart, records, communicate/coordinate with staff remotely, as well as complete documentation.   ___________________________________________ Ihor Austin. Benjamin Stain, M.D., ABFM., CAQSM. Primary Care and Sports Medicine [facility]  Adjunct Instructor of Family Medicine  [employer]

## 2019-03-23 NOTE — Assessment & Plan Note (Signed)
Has developed a dry cough with lisinopril,Unsure if this is related to a viral syndrome or his ACE inhibitor but we are going to stop the lisinopril, and switch to valsartan 320, his blood pressure was uncontrolled anyway. He was having some headaches and neck pain. No chest pain, shortness of breath. 2-week follow-up to reevaluate blood pressure.

## 2019-03-24 ENCOUNTER — Encounter: Payer: 59 | Admitting: Physical Therapy

## 2019-03-25 LAB — NOVEL CORONAVIRUS, NAA: SARS-CoV-2, NAA: NOT DETECTED

## 2019-03-29 ENCOUNTER — Encounter: Payer: 59 | Admitting: Rehabilitative and Restorative Service Providers"

## 2019-03-31 ENCOUNTER — Encounter: Payer: 59 | Admitting: Physical Therapy

## 2019-04-26 ENCOUNTER — Ambulatory Visit: Payer: 59 | Admitting: Family Medicine

## 2019-06-01 LAB — HM COLONOSCOPY

## 2019-06-08 ENCOUNTER — Encounter: Payer: Self-pay | Admitting: Sports Medicine

## 2019-06-14 ENCOUNTER — Other Ambulatory Visit: Payer: Self-pay | Admitting: Family Medicine

## 2019-06-14 DIAGNOSIS — G4700 Insomnia, unspecified: Secondary | ICD-10-CM

## 2019-06-15 NOTE — Telephone Encounter (Signed)
Needs appt w/ me or Dr Ashley Royalty

## 2019-06-15 NOTE — Telephone Encounter (Signed)
Last filled 10/22/2018 #30 with 5 refills.  Needs appt. Has not established care with new provider. Please advise.

## 2019-06-16 NOTE — Telephone Encounter (Signed)
Please schedule patient.

## 2019-06-16 NOTE — Telephone Encounter (Signed)
Left patient a voicemail with information below. Let patient know to call us back to schedule an appointment. 

## 2019-07-05 ENCOUNTER — Ambulatory Visit (INDEPENDENT_AMBULATORY_CARE_PROVIDER_SITE_OTHER): Payer: 59 | Admitting: Family Medicine

## 2019-07-05 ENCOUNTER — Encounter: Payer: Self-pay | Admitting: Family Medicine

## 2019-07-05 ENCOUNTER — Other Ambulatory Visit: Payer: Self-pay

## 2019-07-05 VITALS — BP 130/79 | HR 88 | Temp 97.9°F | Ht 71.0 in | Wt 207.0 lb

## 2019-07-05 DIAGNOSIS — E785 Hyperlipidemia, unspecified: Secondary | ICD-10-CM

## 2019-07-05 DIAGNOSIS — G4700 Insomnia, unspecified: Secondary | ICD-10-CM

## 2019-07-05 DIAGNOSIS — M2550 Pain in unspecified joint: Secondary | ICD-10-CM | POA: Diagnosis not present

## 2019-07-05 DIAGNOSIS — I10 Essential (primary) hypertension: Secondary | ICD-10-CM | POA: Diagnosis not present

## 2019-07-05 DIAGNOSIS — G609 Hereditary and idiopathic neuropathy, unspecified: Secondary | ICD-10-CM

## 2019-07-05 DIAGNOSIS — R7301 Impaired fasting glucose: Secondary | ICD-10-CM

## 2019-07-05 MED ORDER — VALSARTAN 320 MG PO TABS: 320.0000 mg | ORAL_TABLET | Freq: Every day | ORAL | 5 refills | Status: DC

## 2019-07-05 MED ORDER — ZOLPIDEM TARTRATE 10 MG PO TABS: ORAL_TABLET | ORAL | 5 refills | Status: DC

## 2019-07-05 MED ORDER — GABAPENTIN 300 MG PO CAPS: 300.0000 mg | ORAL_CAPSULE | Freq: Three times a day (TID) | ORAL | 3 refills | Status: DC

## 2019-07-05 NOTE — Assessment & Plan Note (Signed)
Blood pressure is not at goal at for age and co-morbidities.  I recommend he continue valsartan at current dose.  In addition they were instructed to follow a low sodium diet with regular exercise to help to maintain adequate control of blood pressure.

## 2019-07-05 NOTE — Assessment & Plan Note (Signed)
He seem to have tolerated Crestor ok.  Update lipid panel today, if still elevated will plan to restart.

## 2019-07-05 NOTE — Assessment & Plan Note (Signed)
Having increased numbness with occasional burning sensation.  Trial of gabapentin initiated.  Side effects including drowsiness reviewed.

## 2019-07-05 NOTE — Assessment & Plan Note (Signed)
Well managed with Remus Loffler, continue.

## 2019-07-05 NOTE — Assessment & Plan Note (Signed)
Having some neuropathic symptoms.  Update A1c today.

## 2019-07-05 NOTE — Assessment & Plan Note (Signed)
Joints are warm and swollen at times.  Check uric acid levels.

## 2019-07-05 NOTE — Patient Instructions (Addendum)
Great to meet you today! Start gabapentin to help with nerve pain. Continue other medications. Have labs completed today. Follow up with me in about 6 months.

## 2019-07-05 NOTE — Progress Notes (Signed)
Spencer Wang - 64 y.o. male MRN [id number]  Date of birth: 05/16/1955  Subjective Chief Complaint  Patient presents with  . Follow-up    HPI Spencer Wang is a 64 y.o. male with history of HTN, HLD, prediabetes and insomnia here today for follow up visit.    -HTN:  Current management with valsartan.  He is doing well with this and denies side effects including symptoms of hypotension.  He has not had chest pain, shortness of breath, palpitations, headache or vision changes  -HLD: Previously treated with crestor however he didn't feel like it was helping so he discontinued.  He was tolerating this well.  He had myalgias with lipitor in the past.    -Insomnia:  Treated with ambien 10mg  nightly.  He takes this intermittently as needed.  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  He will use ambien to help with sleep during these situations.   -Joint pain:  Reports recurrent joint pain including his shoulder and fingers.  Fingers will swell and feel warm.  Has tried meloxicam with slight improvement of symptoms.  He also has some numbness and occasional burning pain in his feet.    ROS:  A comprehensive ROS was completed and negative except as noted per HPI  Allergies  Allergen Reactions  . Ace Inhibitors Cough  . Lipitor [Atorvastatin] Other (See Comments)    Myalgia    Past Medical History:  Diagnosis Date  . Hyperlipidemia   . Hypertension     Past Surgical History:  Procedure Laterality Date  . APPENDECTOMY      Social History   Socioeconomic History  . Marital status: Married    Spouse name: Not on file  . Number of children: Not on file  . Years of education: Not on file  . Highest education level: Not on file  Occupational History  . Not on file  Tobacco Use  . Smoking status: Former Smoker    Quit date: 03/19/1995    Years since quitting: 24.3  .  Smokeless tobacco: Never Used  Substance and Sexual Activity  . Alcohol use: Yes    Alcohol/week: 0.0 standard drinks  . Drug use: No  . Sexual activity: Yes    Partners: Female  Other Topics Concern  . Not on file  Social History Narrative  . Not on file   Social Determinants of Health   Financial Resource Strain:   . Difficulty of Paying Living Expenses:   Food Insecurity:   . Worried About 05/17/1995 in the Last Year:   . Programme researcher, broadcasting/film/video in the Last Year:   Transportation Needs:   . Barista (Medical):   Freight forwarder Lack of Transportation (Non-Medical):   Physical Activity:   . Days of Exercise per Week:   . Minutes of Exercise per Session:   Stress:   . Feeling of Stress :   Social Connections:   . Frequency of Communication with Friends and Family:   . Frequency of Social Gatherings with Friends and Family:   . Attends Religious Services:   . Active Member of Clubs or Organizations:   . Attends [employer] Meetings:   Banker Marital Status:     Family History  Problem Relation Age of Onset  . Hypertension Father   . Hyperlipidemia Father     Health Maintenance  Topic Date  Due  . INFLUENZA VACCINE  10/17/2019  . COLONOSCOPY  05/31/2024  . TETANUS/TDAP  01/22/2025  . COVID-19 Vaccine  Completed  . Hepatitis C Screening  Completed  . HIV Screening  Completed     ----------------------------------------------------------------------------------------------------------------------------------------------------------------------------------------------------------------- Physical Exam BP 130/79   Pulse 88   Temp 97.9 F (36.6 C) (Oral)   Ht 5\' 11"  (1.803 m)   Wt 207 lb (93.9 kg)   BMI 28.87 kg/m   Physical Exam Constitutional:      Appearance: Normal appearance.  HENT:     Head: Normocephalic and atraumatic.  Eyes:     General: No scleral icterus. Cardiovascular:     Rate and Rhythm: Normal rate and regular rhythm.     Pulses:  Normal pulses.  Pulmonary:     Effort: Pulmonary effort is normal.     Breath sounds: Normal breath sounds.  Skin:    General: Skin is warm and dry.  Neurological:     General: No focal deficit present.     Mental Status: He is alert.  Psychiatric:        Mood and Affect: Mood normal.        Behavior: Behavior normal.     ------------------------------------------------------------------------------------------------------------------------------------------------------------------------------------------------------------------- Assessment and Plan  Essential hypertension Blood pressure is not at goal at for age and co-morbidities.  I recommend he continue valsartan at current dose.  In addition they were instructed to follow a low sodium diet with regular exercise to help to maintain adequate control of blood pressure.    IFG (impaired fasting glucose) Having some neuropathic symptoms.  Update A1c today.   Peripheral neuropathy, idiopathic Having increased numbness with occasional burning sensation.  Trial of gabapentin initiated.  Side effects including drowsiness reviewed.   Dyslipidemia He seem to have tolerated Crestor ok.  Update lipid panel today, if still elevated will plan to restart.   Insomnia Well managed with Lorrin Mais, continue.   Arthralgia Joints are warm and swollen at times.  Check uric acid levels.    Meds ordered this encounter  Medications  . zolpidem (AMBIEN) 10 MG tablet    Sig: TAKE 1 TABLET BY MOUTH AT BEDTIME AS NEEDED SLEEP    Dispense:  30 tablet    Refill:  5  . valsartan (DIOVAN) 320 MG tablet    Sig: Take 1 tablet (320 mg total) by mouth daily.    Dispense:  30 tablet    Refill:  5  . gabapentin (NEURONTIN) 300 MG capsule    Sig: Take 1 capsule (300 mg total) by mouth 3 (three) times daily. Take at bedtime x1 week then increase to TID    Dispense:  90 capsule    Refill:  3    Return in about 6 months (around 01/04/2020) for  HTN/HLD.    This visit occurred during the SARS-CoV-2 public health emergency.  Safety protocols were in place, including screening questions prior to the visit, additional usage of staff PPE, and extensive cleaning of exam room while observing appropriate contact time as indicated for disinfecting solutions.

## 2019-07-06 LAB — LIPID PANEL
Cholesterol: 277 mg/dL — ABNORMAL HIGH (ref <200)
HDL: 56 mg/dL (ref >=40)
LDL Cholesterol (Calc): 173 mg/dL (calc) — ABNORMAL HIGH
Non-HDL Cholesterol (Calc): 221 mg/dL (calc) — ABNORMAL HIGH (ref <130)
Total CHOL/HDL Ratio: 4.9 (calc) (ref <5.0)
Triglycerides: 270 mg/dL — ABNORMAL HIGH (ref <150)

## 2019-07-06 LAB — COMPLETE METABOLIC PANEL WITH GFR
AG Ratio: 2 (calc) (ref 1.0–2.5)
ALT: 28 U/L (ref 9–46)
AST: 21 U/L (ref 10–35)
Albumin: 4.5 g/dL (ref 3.6–5.1)
Alkaline phosphatase (APISO): 61 U/L (ref 35–144)
BUN: 22 mg/dL (ref 7–25)
CO2: 29 mmol/L (ref 20–32)
Calcium: 9.7 mg/dL (ref 8.6–10.3)
Chloride: 102 mmol/L (ref 98–110)
Creat: 0.99 mg/dL (ref 0.70–1.25)
GFR, Est African American: 94 mL/min/{1.73_m2} (ref >=60)
GFR, Est Non African American: 81 mL/min/{1.73_m2} (ref >=60)
Globulin: 2.3 g/dL (calc) (ref 1.9–3.7)
Glucose, Bld: 104 mg/dL — ABNORMAL HIGH (ref 65–99)
Potassium: 4.4 mmol/L (ref 3.5–5.3)
Sodium: 138 mmol/L (ref 135–146)
Total Bilirubin: 0.6 mg/dL (ref 0.2–1.2)
Total Protein: 6.8 g/dL (ref 6.1–8.1)

## 2019-07-06 LAB — URIC ACID: Uric Acid, Serum: 6.1 mg/dL (ref 4.0–8.0)

## 2019-07-06 LAB — HEMOGLOBIN A1C
Hgb A1c MFr Bld: 6.1 % of total Hgb — ABNORMAL HIGH (ref <5.7)
Mean Plasma Glucose: 128 (calc)
eAG (mmol/L): 7.1 (calc)

## 2020-01-04 ENCOUNTER — Ambulatory Visit: Payer: 59 | Admitting: Family Medicine

## 2020-01-10 ENCOUNTER — Ambulatory Visit (INDEPENDENT_AMBULATORY_CARE_PROVIDER_SITE_OTHER): Payer: 59 | Admitting: Family Medicine

## 2020-01-10 ENCOUNTER — Other Ambulatory Visit: Payer: Self-pay

## 2020-01-10 ENCOUNTER — Encounter: Payer: Self-pay | Admitting: Family Medicine

## 2020-01-10 VITALS — BP 136/74 | HR 63 | Temp 98.3°F | Wt 213.6 lb

## 2020-01-10 DIAGNOSIS — I10 Essential (primary) hypertension: Secondary | ICD-10-CM | POA: Diagnosis not present

## 2020-01-10 DIAGNOSIS — R7303 Prediabetes: Secondary | ICD-10-CM

## 2020-01-10 DIAGNOSIS — Z23 Encounter for immunization: Secondary | ICD-10-CM

## 2020-01-10 DIAGNOSIS — G629 Polyneuropathy, unspecified: Secondary | ICD-10-CM

## 2020-01-10 DIAGNOSIS — E785 Hyperlipidemia, unspecified: Secondary | ICD-10-CM

## 2020-01-10 DIAGNOSIS — R350 Frequency of micturition: Secondary | ICD-10-CM | POA: Diagnosis not present

## 2020-01-10 DIAGNOSIS — G609 Hereditary and idiopathic neuropathy, unspecified: Secondary | ICD-10-CM

## 2020-01-10 DIAGNOSIS — M7542 Impingement syndrome of left shoulder: Secondary | ICD-10-CM

## 2020-01-10 MED ORDER — PREDNISONE 10 MG (21) PO TBPK: ORAL_TABLET | ORAL | 0 refills | Status: DC

## 2020-01-10 NOTE — Assessment & Plan Note (Signed)
Continued pain in L shoulder.  Some radicular features as well.  Trial of prednisone sent in.  Has shoulder rehab handout at home.  Can try injection if not improving.  Discussed we may need to get MRI if radicular symptoms continue.

## 2020-01-10 NOTE — Assessment & Plan Note (Signed)
Blood pressure is at goal at for age and co-morbidities.  I recommend continuation of valsartan at current strength.  In addition they were instructed to follow a low sodium diet with regular exercise to help to maintain adequate control of blood pressure.

## 2020-01-10 NOTE — Assessment & Plan Note (Signed)
Check TSH and B12.  Did not tolerated gabapentin well as he felt too sedated on this.

## 2020-01-10 NOTE — Patient Instructions (Signed)
Try adding prednisone for the shoulder.  We may need to get an MRI if this is not improving.  Have labs completed, we'll be in touch with results.

## 2020-01-10 NOTE — Assessment & Plan Note (Signed)
Working on lifestyle change.  Update d-ldl

## 2020-01-10 NOTE — Assessment & Plan Note (Signed)
History of prediabetes, update a1c and check UA and PSA.

## 2020-01-10 NOTE — Progress Notes (Signed)
Spencer Wang - 64 y.o. male MRN [id number]  Date of birth: 09-19-1955  Subjective Chief Complaint  Patient presents with  . Hypertension  . Hyperlipidemia    HPI Spencer Wang is a 64 y.o. male here today for follow up of HTN and HLD.  He also has complaint of R shoulder pain  And increased urination.   HTN currently treated with valsartan.  He is tolerating this well without side effects.  BP has been well controlled at home.  He denies symptoms related to HTN including chest pain, shortness of breath, palpitations, headache or vision changes.   L shoulder pain is chronic but with acute worsening recently. Pain with overhead movement but also having some radicular features with radiation into the arm and hand.  Denies weakness in grip strength.  Previous xray of shoulder with AC arthrosis.    He feels like he has had increased urinary frequency for a couple of months.  Denies dysuria, retention, incomplete emptying.    ROS:  A comprehensive ROS was completed and negative except as noted per HPI   Allergies  Allergen Reactions  . Ace Inhibitors Cough  . Lipitor [Atorvastatin] Other (See Comments)    Myalgia    Past Medical History:  Diagnosis Date  . Hyperlipidemia   . Hypertension     Past Surgical History:  Procedure Laterality Date  . APPENDECTOMY      Social History   Socioeconomic History  . Marital status: Married    Spouse name: Not on file  . Number of children: Not on file  . Years of education: Not on file  . Highest education level: Not on file  Occupational History  . Not on file  Tobacco Use  . Smoking status: Former Smoker    Quit date: 03/19/1995    Years since quitting: 24.8  . Smokeless tobacco: Never Used  Substance and Sexual Activity  . Alcohol use: Yes    Alcohol/week: 0.0 standard drinks  . Drug use: No  . Sexual activity: Yes    Partners: Female  Other Topics Concern  . Not on file  Social History Narrative  . Not on file    Social Determinants of Health   Financial Resource Strain:   . Difficulty of Paying Living Expenses: Not on file  Food Insecurity:   . Worried About Programme researcher, broadcasting/film/video in the Last Year: Not on file  . Ran Out of Food in the Last Year: Not on file  Transportation Needs:   . Lack of Transportation (Medical): Not on file  . Lack of Transportation (Non-Medical): Not on file  Physical Activity:   . Days of Exercise per Week: Not on file  . Minutes of Exercise per Session: Not on file  Stress:   . Feeling of Stress : Not on file  Social Connections:   . Frequency of Communication with Friends and Family: Not on file  . Frequency of Social Gatherings with Friends and Family: Not on file  . Attends Religious Services: Not on file  . Active Member of Clubs or Organizations: Not on file  . Attends Banker Meetings: Not on file  . Marital Status: Not on file    Family History  Problem Relation Age of Onset  . Hypertension Father   . Hyperlipidemia Father     Health Maintenance  Topic Date Due  . COLONOSCOPY  05/31/2024  . TETANUS/TDAP  01/22/2025  . INFLUENZA VACCINE  Completed  . COVID-19 Vaccine  Completed  . Hepatitis C Screening  Completed  . HIV Screening  Completed     ----------------------------------------------------------------------------------------------------------------------------------------------------------------------------------------------------------------- Physical Exam BP 136/74 (BP Location: Left Arm, Patient Position: Sitting, Cuff Size: Normal)   Pulse 63   Temp 98.3 F (36.8 C)   Wt 213 lb 9.6 oz (96.9 kg)   SpO2 99%   BMI 29.79 kg/m   Physical Exam Constitutional:      Appearance: Normal appearance.  HENT:     Head: Normocephalic and atraumatic.  Eyes:     General: No scleral icterus. Cardiovascular:     Rate and Rhythm: Normal rate and regular rhythm.  Pulmonary:     Effort: Pulmonary effort is normal.     Breath  sounds: Normal breath sounds.  Musculoskeletal:     Cervical back: Neck supple.  Skin:    General: Skin is warm and dry.  Neurological:     General: No focal deficit present.     Mental Status: He is alert.  Psychiatric:        Mood and Affect: Mood normal.        Behavior: Behavior normal.     ------------------------------------------------------------------------------------------------------------------------------------------------------------------------------------------------------------------- Assessment and Plan  Essential hypertension Blood pressure is at goal at for age and co-morbidities.  I recommend continuation of valsartan at current strength.  In addition they were instructed to follow a low sodium diet with regular exercise to help to maintain adequate control of blood pressure.    Peripheral neuropathy, idiopathic Check TSH and B12.  Did not tolerated gabapentin well as he felt too sedated on this.    Dyslipidemia Working on lifestyle change.  Update d-ldl  Impingement syndrome, shoulder, left Continued pain in L shoulder.  Some radicular features as well.  Trial of prednisone sent in.  Has shoulder rehab handout at home.  Can try injection if not improving.  Discussed we may need to get MRI if radicular symptoms continue.    Urinary frequency History of prediabetes, update a1c and check UA and PSA.    Meds ordered this encounter  Medications  . predniSONE (STERAPRED UNI-PAK 21 TAB) 10 MG (21) TBPK tablet    Sig: Taper as directed on packaging.    Dispense:  21 tablet    Refill:  0   Orders Placed This Encounter  Procedures  . Flu Vaccine QUAD 6+ mos PF IM (Fluarix Quad PF)  . Varicella-zoster vaccine IM (Shingrix)  . COMPLETE METABOLIC PANEL WITH GFR  . Direct LDL  . HgB A1c  . Urinalysis  . PSA  . B12  . TSH    No follow-ups on file.    This visit occurred during the SARS-CoV-2 public health emergency.  Safety protocols were in place,  including screening questions prior to the visit, additional usage of staff PPE, and extensive cleaning of exam room while observing appropriate contact time as indicated for disinfecting solutions.

## 2020-01-11 LAB — HEMOGLOBIN A1C
Hgb A1c MFr Bld: 6 % of total Hgb — ABNORMAL HIGH (ref <5.7)
Mean Plasma Glucose: 126 (calc)
eAG (mmol/L): 7 (calc)

## 2020-01-11 LAB — COMPLETE METABOLIC PANEL WITH GFR
AG Ratio: 2 (calc) (ref 1.0–2.5)
ALT: 25 U/L (ref 9–46)
AST: 21 U/L (ref 10–35)
Albumin: 4.5 g/dL (ref 3.6–5.1)
Alkaline phosphatase (APISO): 53 U/L (ref 35–144)
BUN: 16 mg/dL (ref 7–25)
CO2: 29 mmol/L (ref 20–32)
Calcium: 9.7 mg/dL (ref 8.6–10.3)
Chloride: 104 mmol/L (ref 98–110)
Creat: 0.83 mg/dL (ref 0.70–1.25)
GFR, Est African American: 108 mL/min/{1.73_m2} (ref >=60)
GFR, Est Non African American: 93 mL/min/{1.73_m2} (ref >=60)
Globulin: 2.3 g/dL (calc) (ref 1.9–3.7)
Glucose, Bld: 112 mg/dL — ABNORMAL HIGH (ref 65–99)
Potassium: 4.5 mmol/L (ref 3.5–5.3)
Sodium: 137 mmol/L (ref 135–146)
Total Bilirubin: 0.9 mg/dL (ref 0.2–1.2)
Total Protein: 6.8 g/dL (ref 6.1–8.1)

## 2020-01-11 LAB — URINALYSIS
Bilirubin Urine: NEGATIVE
Glucose, UA: NEGATIVE
Hgb urine dipstick: NEGATIVE
Ketones, ur: NEGATIVE
Leukocytes,Ua: NEGATIVE
Nitrite: NEGATIVE
Protein, ur: NEGATIVE
Specific Gravity, Urine: 1.021 (ref 1.001–1.03)
pH: 6 (ref 5.0–8.0)

## 2020-01-11 LAB — TSH: TSH: 3.22 mIU/L (ref 0.40–4.50)

## 2020-01-11 LAB — LDL CHOLESTEROL, DIRECT: Direct LDL: 179 mg/dL — ABNORMAL HIGH (ref <100)

## 2020-01-11 LAB — PSA: PSA: 2.25 ng/mL (ref <=4.0)

## 2020-01-11 LAB — VITAMIN B12: Vitamin B-12: 370 pg/mL (ref 200–1100)

## 2020-01-13 ENCOUNTER — Other Ambulatory Visit: Payer: Self-pay

## 2020-01-13 ENCOUNTER — Other Ambulatory Visit: Payer: Self-pay | Admitting: Family Medicine

## 2020-01-13 MED ORDER — ROSUVASTATIN CALCIUM 20 MG PO TABS: 20.0000 mg | ORAL_TABLET | Freq: Every day | ORAL | 3 refills | Status: DC

## 2020-02-14 ENCOUNTER — Other Ambulatory Visit: Payer: Self-pay | Admitting: Family Medicine

## 2020-02-14 DIAGNOSIS — G4700 Insomnia, unspecified: Secondary | ICD-10-CM

## 2020-03-27 ENCOUNTER — Other Ambulatory Visit: Payer: Self-pay

## 2020-03-27 ENCOUNTER — Ambulatory Visit (INDEPENDENT_AMBULATORY_CARE_PROVIDER_SITE_OTHER): Payer: 59 | Admitting: Family Medicine

## 2020-03-27 VITALS — Temp 97.7°F

## 2020-03-27 DIAGNOSIS — Z23 Encounter for immunization: Secondary | ICD-10-CM | POA: Diagnosis not present

## 2020-03-27 NOTE — Progress Notes (Signed)
Established Patient Office Visit  Subjective:  Patient ID: Spencer Wang, male    DOB: 1955/12/21  Age: 65 y.o. MRN: [id number]  CC:  Chief Complaint  Patient presents with  . Immunizations    HPI [name] presents for Shingles vaccine.   Past Medical History:  Diagnosis Date  . Hyperlipidemia   . Hypertension     Past Surgical History:  Procedure Laterality Date  . APPENDECTOMY      Family History  Problem Relation Age of Onset  . Hypertension Father   . Hyperlipidemia Father     Social History   Socioeconomic History  . Marital status: Married    Spouse name: Not on file  . Number of children: Not on file  . Years of education: Not on file  . Highest education level: Not on file  Occupational History  . Not on file  Tobacco Use  . Smoking status: Former Smoker    Quit date: 03/19/1995    Years since quitting: 25.0  . Smokeless tobacco: Never Used  Substance and Sexual Activity  . Alcohol use: Yes    Alcohol/week: 0.0 standard drinks  . Drug use: No  . Sexual activity: Yes    Partners: Female  Other Topics Concern  . Not on file  Social History Narrative  . Not on file   Social Determinants of Health   Financial Resource Strain: Not on file  Food Insecurity: Not on file  Transportation Needs: Not on file  Physical Activity: Not on file  Stress: Not on file  Social Connections: Not on file  Intimate Partner Violence: Not on file    Outpatient Medications Prior to Visit  Medication Sig Dispense Refill  . AMBULATORY NON FORMULARY MEDICATION Continuous positive airway pressure (CPAP) machine auto-titrate to a max pressure of 15 cm of H2O pressure, with all supplemental supplies as needed. 1 each 0  . diclofenac sodium (VOLTAREN) 1 % GEL Apply 4 g topically 4 (four) times daily. To affected joint. 100 g 11  . predniSONE (STERAPRED UNI-PAK 21 TAB) 10 MG (21) TBPK tablet Taper as directed on packaging. 21 tablet 0  . rosuvastatin (CRESTOR)  20 MG tablet Take 1 tablet (20 mg total) by mouth daily. 90 tablet 3  . valsartan (DIOVAN) 320 MG tablet Take 1 tablet (320 mg total) by mouth daily. 30 tablet 5  . zolpidem (AMBIEN) 10 MG tablet TAKE 1 TABLET BY MOUTH AT BEDTIME AS NEEDED FOR SLEEP 30 tablet 2   No facility-administered medications prior to visit.    Allergies  Allergen Reactions  . Ace Inhibitors Cough  . Lipitor [Atorvastatin] Other (See Comments)    Myalgia    ROS Review of Systems    Objective:    Physical Exam  Temp 97.7 F (36.5 C) (Oral)  Wt Readings from Last 3 Encounters:  01/10/20 213 lb 9.6 oz (96.9 kg)  07/05/19 207 lb (93.9 kg)  03/01/19 201 lb (91.2 kg)     There are no preventive care reminders to display for this patient.  There are no preventive care reminders to display for this patient.  Lab Results  Component Value Date   TSH 3.22 01/10/2020   Lab Results  Component Value Date   WBC 5.5 10/22/2018   HGB 13.3 10/22/2018   HCT 38.9 10/22/2018   MCV 91.5 10/22/2018   PLT 247 10/22/2018   Lab Results  Component Value Date   NA 137 01/10/2020   K 4.5 01/10/2020  CO2 29 01/10/2020   GLUCOSE 112 (H) 01/10/2020   BUN 16 01/10/2020   CREATININE 0.83 01/10/2020   BILITOT 0.9 01/10/2020   ALKPHOS 47 09/30/2016   AST 21 01/10/2020   ALT 25 01/10/2020   PROT 6.8 01/10/2020   ALBUMIN 4.5 09/30/2016   CALCIUM 9.7 01/10/2020   Lab Results  Component Value Date   CHOL 277 (H) 07/05/2019   Lab Results  Component Value Date   HDL 56 07/05/2019   Lab Results  Component Value Date   LDLCALC 173 (H) 07/05/2019   Lab Results  Component Value Date   TRIG 270 (H) 07/05/2019   Lab Results  Component Value Date   CHOLHDL 4.9 07/05/2019   Lab Results  Component Value Date   HGBA1C 6.0 (H) 01/10/2020      Assessment & Plan:  Shingles vaccine - Patient tolerated injection well without complications. Last shingles vaccine.   Problem List Items Addressed This Visit    None   Visit Diagnoses    Need for shingles vaccine    -  Primary   Relevant Orders   Varicella-zoster vaccine IM (Shingrix) (Completed)      No orders of the defined types were placed in this encounter.   Follow-up: No follow-ups on file.    Esmond Harps, CMA

## 2020-03-27 NOTE — Progress Notes (Signed)
Medical screening examination/treatment was performed by qualified clinical staff member and as supervising physician I was immediately available for consultation/collaboration. I have reviewed documentation and agree with assessment and plan.  Solymar Grace, DO  

## 2020-08-06 ENCOUNTER — Other Ambulatory Visit: Payer: Self-pay | Admitting: Family Medicine

## 2020-08-06 DIAGNOSIS — G4700 Insomnia, unspecified: Secondary | ICD-10-CM

## 2020-11-16 ENCOUNTER — Encounter: Payer: Self-pay | Admitting: Family Medicine

## 2020-11-16 ENCOUNTER — Other Ambulatory Visit: Payer: Self-pay

## 2020-11-16 ENCOUNTER — Ambulatory Visit (INDEPENDENT_AMBULATORY_CARE_PROVIDER_SITE_OTHER): Payer: 59 | Admitting: Family Medicine

## 2020-11-16 VITALS — BP 148/84 | HR 72 | Temp 97.5°F | Wt 211.0 lb

## 2020-11-16 DIAGNOSIS — M7541 Impingement syndrome of right shoulder: Secondary | ICD-10-CM

## 2020-11-16 DIAGNOSIS — R351 Nocturia: Secondary | ICD-10-CM | POA: Diagnosis not present

## 2020-11-16 DIAGNOSIS — I10 Essential (primary) hypertension: Secondary | ICD-10-CM

## 2020-11-16 DIAGNOSIS — R350 Frequency of micturition: Secondary | ICD-10-CM

## 2020-11-16 LAB — POCT URINALYSIS DIP (CLINITEK)
Bilirubin, UA: NEGATIVE
Glucose, UA: NEGATIVE mg/dL
Ketones, POC UA: NEGATIVE mg/dL
Leukocytes, UA: NEGATIVE
Nitrite, UA: NEGATIVE
POC PROTEIN,UA: NEGATIVE
Spec Grav, UA: 1.02 (ref 1.010–1.025)
Urobilinogen, UA: 0.2 E.U./dL
pH, UA: 5.5 (ref 5.0–8.0)

## 2020-11-16 MED ORDER — VALSARTAN 160 MG PO TABS: 160.0000 mg | ORAL_TABLET | Freq: Every day | ORAL | 1 refills | Status: DC

## 2020-11-16 MED ORDER — SILDENAFIL CITRATE 100 MG PO TABS: 50.0000 mg | ORAL_TABLET | Freq: Every day | ORAL | 11 refills | Status: DC | PRN

## 2020-11-16 MED ORDER — PREDNISONE 10 MG (21) PO TBPK: ORAL_TABLET | ORAL | 0 refills | Status: DC

## 2020-11-16 NOTE — Patient Instructions (Signed)
Shoulder Impingement Syndrome °Shoulder impingement syndrome is a condition that causes pain when connective tissues (tendons) surrounding the shoulder joint become pinched. These tendons are part of the group of muscles and tissues that help to stabilize the shoulder (rotator cuff). Beneath the rotator cuff is a fluid-filled sac (bursa) that allows the muscles and tendons to glide smoothly. °The bursa may become swollen or irritated (bursitis). Bursitis, swelling in the rotator cuff tendons, or both conditions can decrease how much space is under a bone in the shoulder joint (acromion), resulting in impingement. °What are the causes? °Shoulder impingement syndrome may be caused by bursitis or swelling of the rotator cuff tendons, which may result from: °Repetitive overhead arm movements. °Falling onto the shoulder. °Weakness in the shoulder muscles. °What increases the risk? °You may be more likely to develop this condition if you: °Play sports that involve throwing, such as baseball. °Participate in sports such as tennis, volleyball, and swimming. °Work as a painter, carpenter, or fruit picker. °Some people are also more likely to develop impingement syndrome because of the shape of their acromion bone. °What are the signs or symptoms? °The main symptom of this condition is pain on the front or side of the shoulder. The pain may: °Get worse when lifting or raising the arm. °Get worse at night. °Wake you up from sleeping. °Feel sharp when the shoulder is moved and then fade to an ache. °Other symptoms may include: °Tenderness. °Stiffness. °Inability to raise the arm above shoulder level or behind the body. °Weakness. °How is this diagnosed? °This condition may be diagnosed based on: °Your symptoms and medical history. °A physical exam. °Imaging tests, such as: °X-rays. °MRI. °Ultrasound. °How is this treated? °This condition may be treated by: °Resting your shoulder and avoiding all activities that cause pain or  put stress on the shoulder. °Icing your shoulder. °NSAIDs to help reduce pain and swelling. °One or more injections of medicines to numb the area and reduce inflammation. °Physical therapy. °Surgery. This may be needed if nonsurgical treatments have not helped. Surgery may involve repairing the rotator cuff, reshaping the acromion, or removing the bursa. °Follow these instructions at home: °Managing pain, stiffness, and swelling ° °If directed, put ice on the injured area. °Put ice in a plastic bag. °Place a towel between your skin and the bag. °Leave the ice on for 20 minutes, 2-3 times a day. °Activity °Rest and return to your normal activities as told by your health care provider. Ask your health care provider what activities are safe for you. °Do exercises as told by your health care provider. °General instructions °Do not use any products that contain nicotine or tobacco, such as cigarettes, e-cigarettes, and chewing tobacco. These can delay healing. If you need help quitting, ask your health care provider. °Ask your health care provider when it is safe for you to drive. °Take over-the-counter and prescription medicines only as told by your health care provider. °Keep all follow-up visits as told by your health care provider. This is important. °How is this prevented? °Give your body time to rest between periods of activity. °Be safe and responsible while being active. This will help you avoid falls. °Maintain physical fitness, including strength and flexibility. °Contact a health care provider if: °Your symptoms have not improved after 1-2 months of treatment and rest. °You cannot lift your arm away from your body. °Summary °Shoulder impingement syndrome is a condition that causes pain when connective tissues (tendons) surrounding the shoulder joint become pinched. °The   main symptom of this condition is pain on the front or side of the shoulder. °This condition is usually treated with rest, ice, and pain  medicines as needed. °This information is not intended to replace advice given to you by your health care provider. Make sure you discuss any questions you have with your health care provider. °Document Revised: 06/26/2018 Document Reviewed: 08/27/2017 °Elsevier Patient Education © 2022 [employer] ° °

## 2020-11-17 LAB — PSA: PSA: 2.27 ng/mL (ref <=4.00)

## 2020-11-20 ENCOUNTER — Other Ambulatory Visit: Payer: Self-pay | Admitting: Family Medicine

## 2020-11-20 DIAGNOSIS — M7541 Impingement syndrome of right shoulder: Secondary | ICD-10-CM | POA: Insufficient documentation

## 2020-11-20 DIAGNOSIS — R351 Nocturia: Secondary | ICD-10-CM | POA: Insufficient documentation

## 2020-11-20 MED ORDER — TAMSULOSIN HCL 0.4 MG PO CAPS
0.4000 mg | ORAL_CAPSULE | Freq: Every day | ORAL | 3 refills | Status: DC
Start: 2020-11-20 — End: 2022-04-02

## 2020-11-20 NOTE — Progress Notes (Signed)
Spencer Wang - 65 y.o. male MRN [id number]  Date of birth: Jul 03, 1955  Subjective Chief Complaint  Patient presents with   Urinary Frequency    HPI Spencer Wang is a 65 year old male here today with complaint of urinary frequency and right shoulder pain.  He has had urinary frequency for several weeks.  This is worse at night.  Reports that he gets of 3-4 times each night to urinate.  He denies pain with urination.  He does have some occasional trouble starting his urinary stream.  He has had some shoulder pain for a couple of weeks.  He denies any known injury or overuse.  Pain is worse with overhead movement, internal rotation and reaching behind his back.  He denies neck pain, numbness or tingling.  ROS:  A comprehensive ROS was completed and negative except as noted per HPI  Allergies  Allergen Reactions   Ace Inhibitors Cough   Lipitor [Atorvastatin] Other (See Comments)    Myalgia    Past Medical History:  Diagnosis Date   Hyperlipidemia    Hypertension     Past Surgical History:  Procedure Laterality Date   APPENDECTOMY      Social History   Socioeconomic History   Marital status: Married    Spouse name: Not on file   Number of children: Not on file   Years of education: Not on file   Highest education level: Not on file  Occupational History   Not on file  Tobacco Use   Smoking status: Former    Types: Cigarettes    Quit date: 03/19/1995    Years since quitting: 25.6   Smokeless tobacco: Never  Substance and Sexual Activity   Alcohol use: Yes    Alcohol/week: 0.0 standard drinks   Drug use: No   Sexual activity: Yes    Partners: Female  Other Topics Concern   Not on file  Social History Narrative   Not on file   Social Determinants of Health   Financial Resource Strain: Not on file  Food Insecurity: Not on file  Transportation Needs: Not on file  Physical Activity: Not on file  Stress: Not on file  Social Connections: Not on file     Family History  Problem Relation Age of Onset   Hypertension Father    Hyperlipidemia Father     Health Maintenance  Topic Date Due   COVID-19 Vaccine (3 - Booster for [employer] series) 06/03/2020   INFLUENZA VACCINE  10/16/2020   COLONOSCOPY (Pts 45-80yrs Insurance coverage will need to be confirmed)  05/31/2024   TETANUS/TDAP  01/22/2025   Hepatitis C Screening  Completed   HIV Screening  Completed   Zoster Vaccines- Shingrix  Completed   Pneumococcal Vaccine 9-40 Years old  Aged Out   HPV VACCINES  Aged Out     ----------------------------------------------------------------------------------------------------------------------------------------------------------------------------------------------------------------- Physical Exam BP (!) 148/84   Pulse 72   Temp (!) 97.5 F (36.4 C) (Oral)   Wt 211 lb (95.7 kg)   SpO2 96%   BMI 29.43 kg/m   Physical Exam Constitutional:      Appearance: Normal appearance.  Eyes:     General: No scleral icterus. Cardiovascular:     Rate and Rhythm: Normal rate and regular rhythm.  Pulmonary:     Effort: Pulmonary effort is normal.     Breath sounds: Normal breath sounds.  Musculoskeletal:     Cervical back: Neck supple.     Comments: Range of motion of right shoulder is limited  in abduction and flexion to approximately 90 degrees.  He has positive Hawkins test.  Good rotator cuff strength.  Neurological:     Mental Status: He is alert.  Psychiatric:        Mood and Affect: Mood normal.        Behavior: Behavior normal.    ------------------------------------------------------------------------------------------------------------------------------------------------------------------------------------------------------------------- Assessment and Plan  Nocturia Unremarkable urinalysis.  Checking PSA today.    Shoulder impingement syndrome, right Trial of prednisone taper.  Discussed that if not improving we can try  injection.  Essential hypertension Blood pressure elevated today.  Currently out of valsartan, renewed.   Meds ordered this encounter  Medications   predniSONE (STERAPRED UNI-PAK 21 TAB) 10 MG (21) TBPK tablet    Sig: Taper as directed on packaging    Dispense:  21 tablet    Refill:  0   sildenafil (VIAGRA) 100 MG tablet    Sig: Take 0.5-1 tablets (50-100 mg total) by mouth daily as needed for erectile dysfunction.    Dispense:  10 tablet    Refill:  11   valsartan (DIOVAN) 160 MG tablet    Sig: Take 1 tablet (160 mg total) by mouth daily.    Dispense:  90 tablet    Refill:  1    No follow-ups on file.    This visit occurred during the SARS-CoV-2 public health emergency.  Safety protocols were in place, including screening questions prior to the visit, additional usage of staff PPE, and extensive cleaning of exam room while observing appropriate contact time as indicated for disinfecting solutions.

## 2020-11-20 NOTE — Assessment & Plan Note (Signed)
Unremarkable urinalysis.  Checking PSA today.

## 2020-11-20 NOTE — Assessment & Plan Note (Signed)
Trial of prednisone taper.  Discussed that if not improving we can try injection.

## 2020-11-20 NOTE — Assessment & Plan Note (Signed)
Blood pressure elevated today.  Currently out of valsartan, renewed.

## 2021-01-11 ENCOUNTER — Other Ambulatory Visit: Payer: Self-pay | Admitting: Family Medicine

## 2021-01-11 DIAGNOSIS — G4700 Insomnia, unspecified: Secondary | ICD-10-CM

## 2021-06-18 ENCOUNTER — Encounter: Payer: Self-pay | Admitting: Family Medicine

## 2021-07-01 ENCOUNTER — Other Ambulatory Visit: Payer: Self-pay | Admitting: Family Medicine

## 2021-07-01 DIAGNOSIS — G4700 Insomnia, unspecified: Secondary | ICD-10-CM

## 2021-09-24 ENCOUNTER — Telehealth (INDEPENDENT_AMBULATORY_CARE_PROVIDER_SITE_OTHER): Payer: 59 | Admitting: Sports Medicine

## 2021-09-24 ENCOUNTER — Ambulatory Visit: Payer: 59 | Admitting: Medical-Surgical

## 2021-09-24 DIAGNOSIS — H1033 Unspecified acute conjunctivitis, bilateral: Secondary | ICD-10-CM | POA: Diagnosis not present

## 2021-09-24 DIAGNOSIS — H109 Unspecified conjunctivitis: Secondary | ICD-10-CM | POA: Insufficient documentation

## 2021-09-24 MED ORDER — ERYTHROMYCIN 5 MG/GM OP OINT
1.0000 | TOPICAL_OINTMENT | Freq: Three times a day (TID) | OPHTHALMIC | 0 refills | Status: DC
Start: 2021-09-24 — End: 2022-01-08

## 2021-09-24 NOTE — Assessment & Plan Note (Signed)
Pleasant 66 year old male, has had some viral type constitutional symptoms including chills, he has now developed painful reddish eyes with purulent type discharge. In the morning vision is blurry but clears through the day. We did a telephone visit as I was unable to see what the eyes look like, so I will go ahead and cover bacterial etiologies. Adding topical erythromycin. Follow-up with me if not significantly better in a week.

## 2021-09-24 NOTE — Progress Notes (Signed)
   Virtual Visit via Telephone   I connected with  Spencer Wang  on 09/24/21 by telephone/telehealth and verified that I am speaking with the correct person using two identifiers.   I discussed the limitations, risks, security and privacy concerns of performing an evaluation and management service by telephone, including the higher likelihood of inaccurate diagnosis and treatment, and the availability of in person appointments.  We also discussed the likely need of an additional face to face encounter for complete and high quality delivery of care.  I also discussed with the patient that there may be a patient responsible charge related to this service. The patient expressed understanding and wishes to proceed.  Provider location is in medical facility. Patient location is at their home, different from provider location. People involved in care of the patient during this telehealth encounter were myself, my nurse/medical assistant, and my front office/scheduling team member.  Review of Systems: No fevers, chills, night sweats, weight loss, chest pain, or shortness of breath.   Objective Findings:    General: Speaking full sentences, no audible heavy breathing.  Sounds alert and appropriately interactive.    Independent interpretation of tests performed by another provider:   None.  Brief History, Exam, Impression, and Recommendations:    Conjunctivitis Pleasant 66 year old male, has had some viral type constitutional symptoms including chills, he has now developed painful reddish eyes with purulent type discharge. In the morning vision is blurry but clears through the day. We did a telephone visit as I was unable to see what the eyes look like, so I will go ahead and cover bacterial etiologies. Adding topical erythromycin. Follow-up with me if not significantly better in a week.   I discussed the above assessment and treatment plan with the patient. The patient was provided an  opportunity to ask questions and all were answered. The patient agreed with the plan and demonstrated an understanding of the instructions.   The patient was advised to call back or seek an in-person evaluation if the symptoms worsen or if the condition fails to improve as anticipated.   I provided 30 minutes of verbal and non-verbal time during this encounter date, time was needed to gather information, review chart, records, communicate/coordinate with staff remotely, as well as complete documentation.   ____________________________________________ Ihor Austin. Benjamin Stain, M.D., ABFM., CAQSM., AME. Primary Care and Sports Medicine [facility]  Adjunct Professor of Family Medicine  [employer]  Restaurant manager, fast food

## 2021-12-05 IMAGING — MR MR HEAD WO/W CM
12 series · 48 of 48 positions shown · IV contrast (9 mL Gadavist)
Comparison: None.

CLINICAL DATA: Chronic headache.  Pulsatile nature on the right.

EXAM:
MRI HEAD WITHOUT AND WITH CONTRAST
TECHNIQUE: Multiplanar, multiecho pulse sequences of the brain and surrounding
structures were obtained without and with intravenous contrast.
CONTRAST:  9mL GADAVIST GADOBUTROL 1 MMOL/ML IV SOLN

[Series 3: DWI · axial · 3.0mm · 1.20mm/px · z∈[-54,+93]mm · 3 of 54 slices shown (1 of 2)]
[im 1/54]
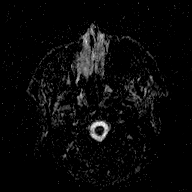
[im 27/54]
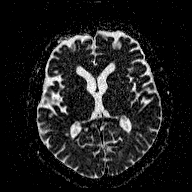
[im 54/54]
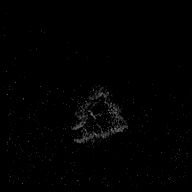

[Series 5: DWI · coronal · 3.0mm · 1.15mm/px · 2 of 51 slices shown (2 of 2)]
[im 1/51]
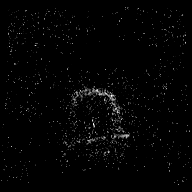
[im 51/51]
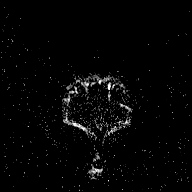

[Series 6: T1 · sagittal · 5.0mm · 0.45mm/px · 2 of 25 slices shown (1 of 2)]
[im 1/25]
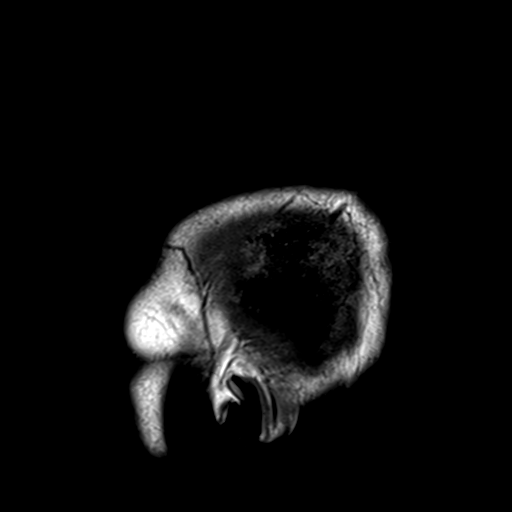
[im 25/25]
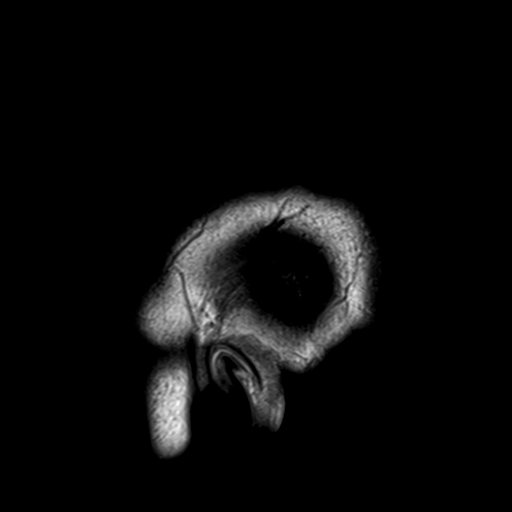

[Series 7: T2 · axial · 5.0mm · 0.72mm/px · z∈[-48,+90]mm · 2 of 23 slices shown (1 of 2)]
[im 1/23]
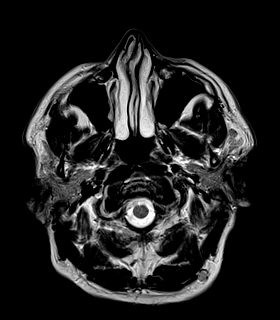
[im 23/23]
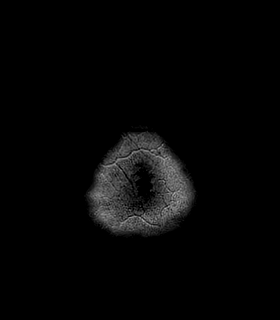

[Series 8: FLAIR · axial · 3.0mm · 0.45mm/px · z∈[-52,+93]mm · 4 of 55 slices shown]
[im 1/55]
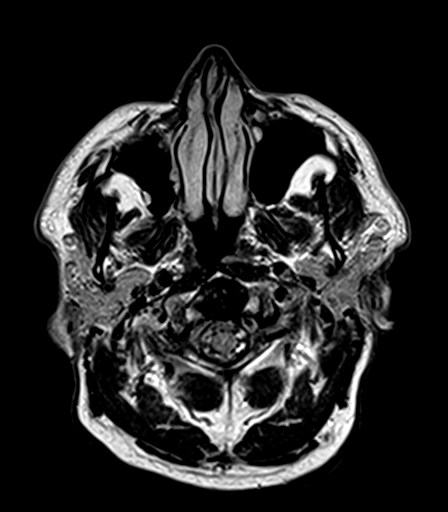
[im 19/55]
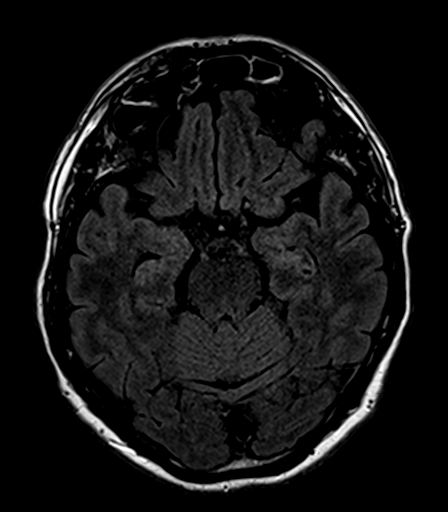
[im 37/55]
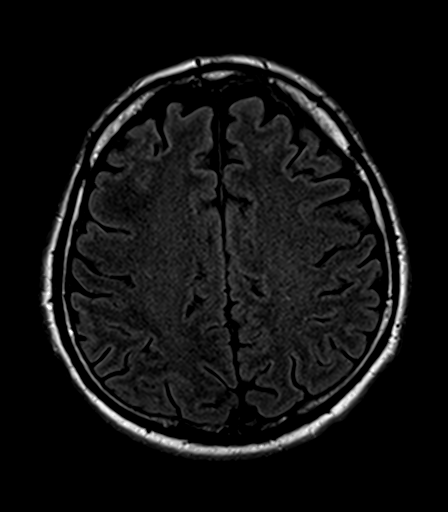
[im 55/55]
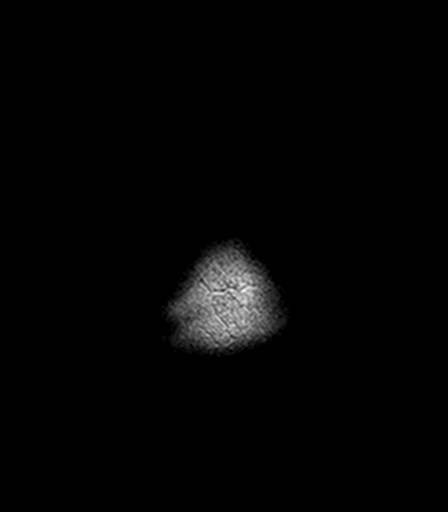

[Series 9: T2 · axial · 5.0mm · 0.72mm/px · z∈[-48,+90]mm · 2 of 23 slices shown (2 of 2)]
[im 1/23]
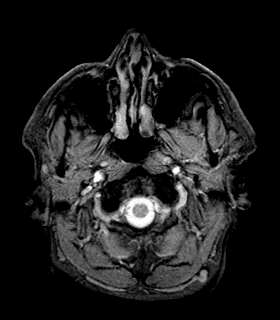
[im 23/23]
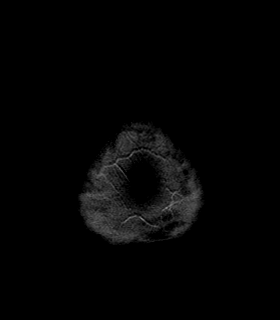

[Series 10: T1 · axial · 1.0mm · 1.00mm/px · z∈[-45,+98]mm · 11 of 160 slices shown (2 of 2)]
[im 1/160]
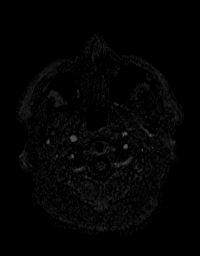
[im 16/160]
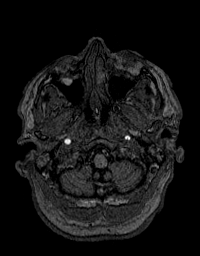
[im 32/160]
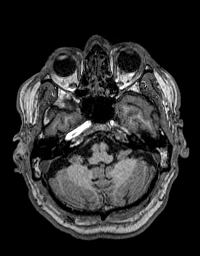
[im 48/160]
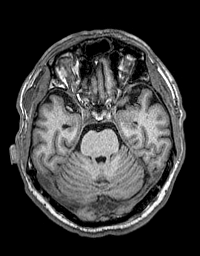
[im 64/160]
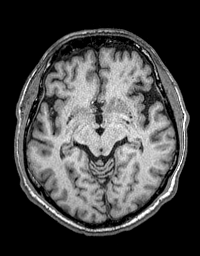
[im 80/160]
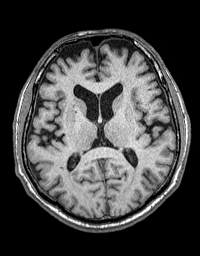
[im 96/160]
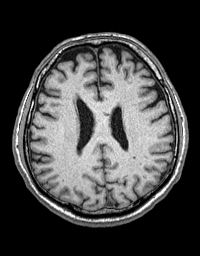
[im 112/160]
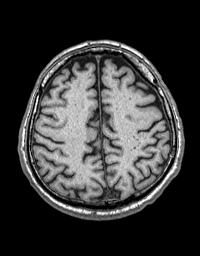
[im 128/160]
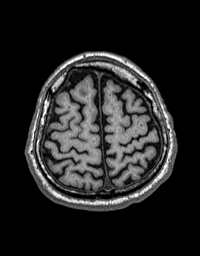
[im 144/160]
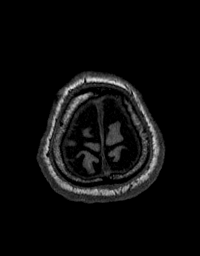
[im 160/160]
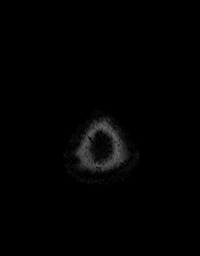

[Series 11: T2 post-contrast · coronal · 5.0mm · 0.43mm/px · 2 of 31 slices shown]
[im 1/31]
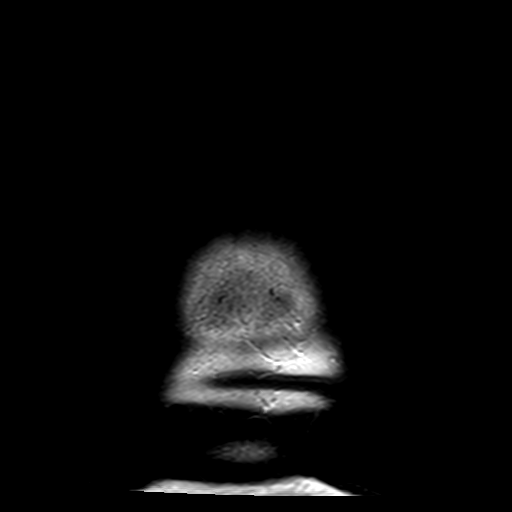
[im 31/31]
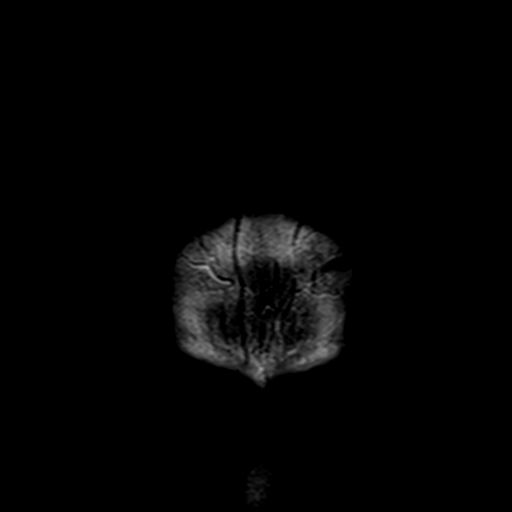

[Series 12: T1 post-contrast · axial · 1.0mm · 1.00mm/px · z∈[-45,+98]mm · 11 of 160 slices shown (1 of 2)]
[im 1/160]
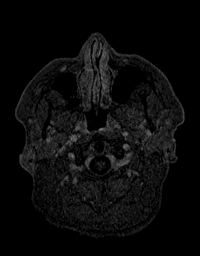
[im 16/160]
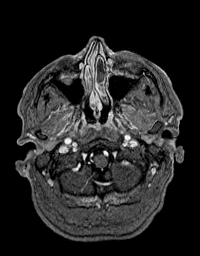
[im 32/160]
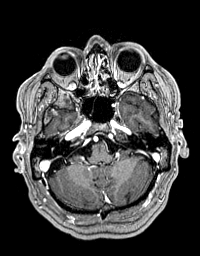
[im 48/160]
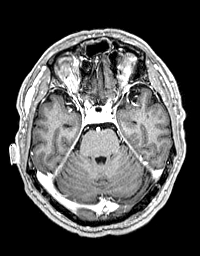
[im 64/160]
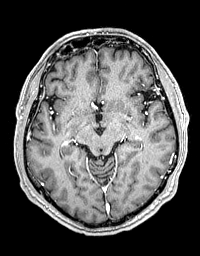
[im 80/160]
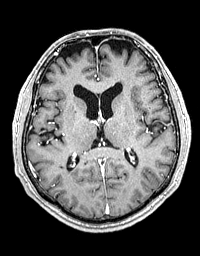
[im 96/160]
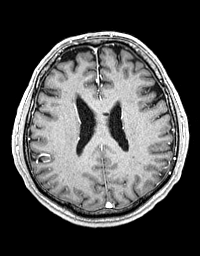
[im 112/160]
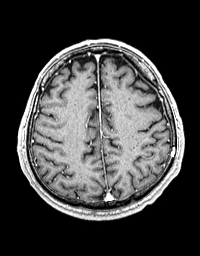
[im 128/160]
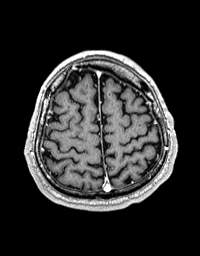
[im 144/160]
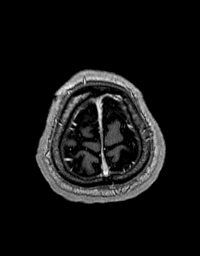
[im 160/160]
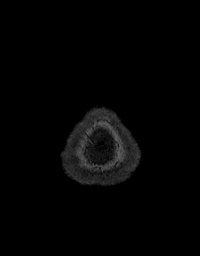

[Series 13: T1 post-contrast · coronal · 5.0mm · 0.43mm/px · 2 of 31 slices shown (2 of 2)]
[im 1/31]
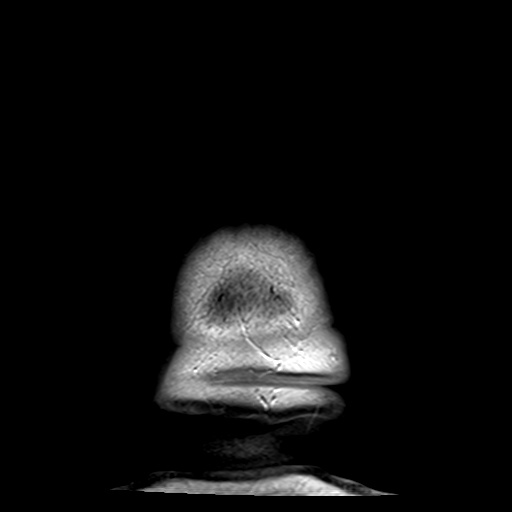
[im 31/31]
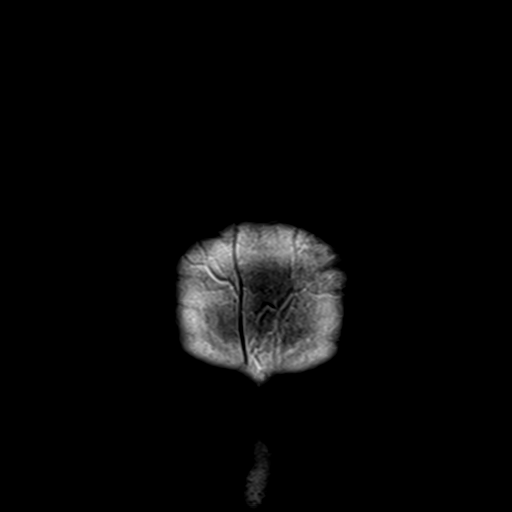

[Series 100: (id) ax · axial · 3.0mm · 1.20mm/px · z∈[-54,+93]mm · 4 of 55 slices shown]
[im 1/55]
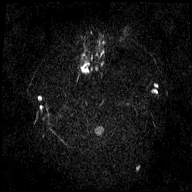
[im 19/55]
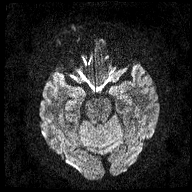
[im 37/55]
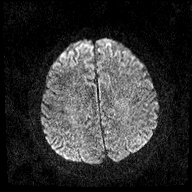
[im 55/55]
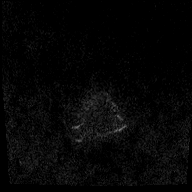

[Series 101: (id) cor · coronal · 3.0mm · 1.15mm/px · 3 of 51 slices shown]
[im 1/51]
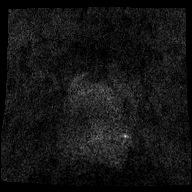
[im 26/51]
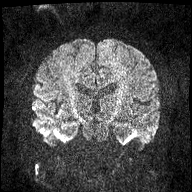
[im 51/51]
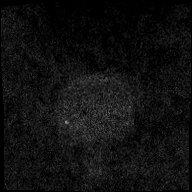

[48 of 48 positions shown; findings below may reference images not displayed]

FINDINGS: Brain: The brain has a normal appearance without evidence of
malformation, atrophy, old or acute small or large vessel
infarction, mass lesion, hemorrhage, hydrocephalus or extra-axial
collection. After contrast administration, no abnormal enhancement
occurs.

Vascular: Major vessels at the base of the brain show flow. Venous
sinuses appear patent.

Skull and upper cervical spine: Normal.

Sinuses/Orbits: Clear/normal.

Other: None significant.
IMPRESSION: Normal examination. No abnormality seen to explain headache. No
vascular lesion evident.

## 2021-12-21 ENCOUNTER — Other Ambulatory Visit: Payer: Self-pay | Admitting: Family Medicine

## 2021-12-21 DIAGNOSIS — G4700 Insomnia, unspecified: Secondary | ICD-10-CM

## 2021-12-24 ENCOUNTER — Telehealth: Payer: Self-pay | Admitting: Family Medicine

## 2021-12-24 DIAGNOSIS — G4700 Insomnia, unspecified: Secondary | ICD-10-CM

## 2021-12-24 MED ORDER — ZOLPIDEM TARTRATE 10 MG PO TABS: ORAL_TABLET | ORAL | 2 refills | Status: DC

## 2021-12-24 NOTE — Telephone Encounter (Signed)
Patient called and notified that medication was sent in. -Buel Ream

## 2021-12-24 NOTE — Telephone Encounter (Signed)
Pt called and requested his sleep medication to be refilled, sent request in on Friday in the afternoon but would like it before he leaves, sent into [facility] on file. Spencer Wang

## 2021-12-24 NOTE — Telephone Encounter (Signed)
Rx sent in

## 2022-01-08 ENCOUNTER — Encounter: Payer: Self-pay | Admitting: Family Medicine

## 2022-01-08 ENCOUNTER — Ambulatory Visit (INDEPENDENT_AMBULATORY_CARE_PROVIDER_SITE_OTHER): Payer: 59 | Admitting: Family Medicine

## 2022-01-08 VITALS — BP 137/80 | HR 64 | Ht 71.0 in | Wt 210.0 lb

## 2022-01-08 DIAGNOSIS — I10 Essential (primary) hypertension: Secondary | ICD-10-CM

## 2022-01-08 DIAGNOSIS — R5383 Other fatigue: Secondary | ICD-10-CM

## 2022-01-08 DIAGNOSIS — F5101 Primary insomnia: Secondary | ICD-10-CM

## 2022-01-08 DIAGNOSIS — E559 Vitamin D deficiency, unspecified: Secondary | ICD-10-CM

## 2022-01-08 DIAGNOSIS — R351 Nocturia: Secondary | ICD-10-CM | POA: Diagnosis not present

## 2022-01-08 DIAGNOSIS — E785 Hyperlipidemia, unspecified: Secondary | ICD-10-CM

## 2022-01-08 DIAGNOSIS — M7542 Impingement syndrome of left shoulder: Secondary | ICD-10-CM

## 2022-01-08 MED ORDER — PREDNISONE 5 MG (48) PO TBPK: ORAL_TABLET | ORAL | 0 refills | Status: DC

## 2022-01-08 NOTE — Progress Notes (Signed)
Spencer Wang - 66 y.o. male MRN [id number]  Date of birth: [birth year]  Subjective Chief Complaint  Patient presents with   Shoulder Pain   Urinary Frequency    HPI Spencer Wang is a 66 year old male here today for follow-up visit.  He does have complaint of left shoulder pain.  Previously had pain in right shoulder however this resolved.  Left shoulder pain began recently.  Does not recall any known injury or overuse.  Steroids were helpful for this in the past.  He denies numbness or tingling into the arm.  He continues to have some urinary frequency.  He denies pain with urination.  He does find that he gets up frequently at night to urinate.  Tried Flomax previously and this has not been helpful.  He does feel more fatigued over the past few months.  He does continue to stay pretty active.  He denies chest pain or dyspnea.    ROS:  A comprehensive ROS was completed and negative except as noted per HPI   No Known Allergies  Past Medical History:  Diagnosis Date   Hyperlipidemia    Hypertension     Past Surgical History:  Procedure Laterality Date   APPENDECTOMY      Social History   Socioeconomic History   Marital status: Married    Spouse name: Not on file   Number of children: Not on file   Years of education: Not on file   Highest education level: Not on file  Occupational History   Not on file  Tobacco Use   Smoking status: Former    Types: Cigarettes    Quit date: 03/19/1995    Years since quitting: 26.8   Smokeless tobacco: Never  Substance and Sexual Activity   Alcohol use: Yes    Alcohol/week: 0.0 standard drinks of alcohol   Drug use: No   Sexual activity: Yes    Partners: Female  Other Topics Concern   Not on file  Social History Narrative   Not on file   Social Determinants of Health   Financial Resource Strain: Not on file  Food Insecurity: Not on file  Transportation Needs: Not on file  Physical Activity: Not on file  Stress: Not  on file  Social Connections: Not on file    Family History  Problem Relation Age of Onset   Hypertension Father    Hyperlipidemia Father     Health Maintenance  Topic Date Due   COVID-19 Vaccine (3 - Booster for [employer] series) 04/18/2022 (Originally 03/31/2020)   INFLUENZA VACCINE  06/16/2022 (Originally 10/16/2021)   Pneumonia Vaccine 80+ Years old (1 - PCV) 01/09/2023 (Originally 12/16/1961)   COLONOSCOPY (Pts 45-80yrs Insurance coverage will need to be confirmed)  05/31/2024   TETANUS/TDAP  01/22/2025   Hepatitis C Screening  Completed   Zoster Vaccines- Shingrix  Completed   HPV VACCINES  Aged Out     ----------------------------------------------------------------------------------------------------------------------------------------------------------------------------------------------------------------- Physical Exam BP 137/80 (BP Location: Left Arm, Patient Position: Sitting, Cuff Size: Large)   Pulse 64   Ht 5\' 11"  (1.803 m)   Wt 210 lb (95.3 kg)   SpO2 97%   BMI 29.29 kg/m   Physical Exam  ------------------------------------------------------------------------------------------------------------------------------------------------------------------------------------------------------------------- Assessment and Plan  Nocturia Continues to have nocturia and frequency.  Flomax has not been helpful.  Updating PSA checking urinalysis.  We will go ahead and send to urology as well.  Insomnia Using Ambien as needed.    Other fatigue Updated labs ordered today.  No cardiopulmonary  symptoms.  Impingement syndrome, shoulder, left Given handout for home exercises.  Adding steroid taper.  If not improving we can consider shoulder injection and/or referral to PT.   Meds ordered this encounter  Medications   predniSONE (STERAPRED UNI-PAK 48 TAB) 5 MG (48) TBPK tablet    Sig: Taper as directed on packaging.  12 day taper    Dispense:  48 tablet    Refill:  0     No follow-ups on file.    This visit occurred during the SARS-CoV-2 public health emergency.  Safety protocols were in place, including screening questions prior to the visit, additional usage of staff PPE, and extensive cleaning of exam room while observing appropriate contact time as indicated for disinfecting solutions.

## 2022-01-09 LAB — COMPLETE METABOLIC PANEL WITH GFR
AG Ratio: 2 (calc) (ref 1.0–2.5)
ALT: 22 U/L (ref 9–46)
AST: 20 U/L (ref 10–35)
Albumin: 4.7 g/dL (ref 3.6–5.1)
Alkaline phosphatase (APISO): 47 U/L (ref 35–144)
BUN: 14 mg/dL (ref 7–25)
CO2: 30 mmol/L (ref 20–32)
Calcium: 9.7 mg/dL (ref 8.6–10.3)
Chloride: 103 mmol/L (ref 98–110)
Creat: 0.97 mg/dL (ref 0.70–1.35)
Globulin: 2.4 g/dL (calc) (ref 1.9–3.7)
Glucose, Bld: 95 mg/dL (ref 65–99)
Potassium: 4.6 mmol/L (ref 3.5–5.3)
Sodium: 139 mmol/L (ref 135–146)
Total Bilirubin: 1 mg/dL (ref 0.2–1.2)
Total Protein: 7.1 g/dL (ref 6.1–8.1)
eGFR: 86 mL/min/{1.73_m2} (ref >=60)

## 2022-01-09 LAB — URINALYSIS, ROUTINE W REFLEX MICROSCOPIC
Bilirubin Urine: NEGATIVE
Glucose, UA: NEGATIVE
Hgb urine dipstick: NEGATIVE
Ketones, ur: NEGATIVE
Leukocytes,Ua: NEGATIVE
Nitrite: NEGATIVE
Protein, ur: NEGATIVE
Specific Gravity, Urine: 1.01 (ref 1.001–1.035)
pH: 7 (ref 5.0–8.0)

## 2022-01-09 LAB — CBC WITH DIFFERENTIAL/PLATELET
Absolute Monocytes: 399 cells/uL (ref 200–950)
Basophils Absolute: 63 cells/uL (ref 0–200)
Basophils Relative: 0.9 %
Eosinophils Absolute: 182 cells/uL (ref 15–500)
Eosinophils Relative: 2.6 %
HCT: 42.3 % (ref 38.5–50.0)
Hemoglobin: 14.4 g/dL (ref 13.2–17.1)
Lymphs Abs: 2576 cells/uL (ref 850–3900)
MCH: 30.6 pg (ref 27.0–33.0)
MCHC: 34 g/dL (ref 32.0–36.0)
MCV: 90 fL (ref 80.0–100.0)
MPV: 9.1 fL (ref 7.5–12.5)
Monocytes Relative: 5.7 %
Neutro Abs: 3780 cells/uL (ref 1500–7800)
Neutrophils Relative %: 54 %
Platelets: 277 10*3/uL (ref 140–400)
RBC: 4.7 10*6/uL (ref 4.20–5.80)
RDW: 12.8 % (ref 11.0–15.0)
Total Lymphocyte: 36.8 %
WBC: 7 10*3/uL (ref 3.8–10.8)

## 2022-01-09 LAB — LIPID PANEL
Cholesterol: 272 mg/dL — ABNORMAL HIGH (ref <200)
HDL: 62 mg/dL (ref >=40)
LDL Cholesterol (Calc): 184 mg/dL (calc) — ABNORMAL HIGH
Non-HDL Cholesterol (Calc): 210 mg/dL (calc) — ABNORMAL HIGH (ref <130)
Total CHOL/HDL Ratio: 4.4 (calc) (ref <5.0)
Triglycerides: 124 mg/dL (ref <150)

## 2022-01-09 LAB — VITAMIN D 25 HYDROXY (VIT D DEFICIENCY, FRACTURES): Vit D, 25-Hydroxy: 19 ng/mL — ABNORMAL LOW (ref 30–100)

## 2022-01-09 LAB — PSA: PSA: 3.46 ng/mL (ref <=4.00)

## 2022-01-09 LAB — VITAMIN B12: Vitamin B-12: 350 pg/mL (ref 200–1100)

## 2022-01-13 DIAGNOSIS — R5383 Other fatigue: Secondary | ICD-10-CM | POA: Insufficient documentation

## 2022-01-13 NOTE — Assessment & Plan Note (Signed)
Updated labs ordered today.  No cardiopulmonary symptoms.

## 2022-01-13 NOTE — Assessment & Plan Note (Addendum)
Using Ambien as needed.

## 2022-01-13 NOTE — Assessment & Plan Note (Signed)
Continues to have nocturia and frequency.  Flomax has not been helpful.  Updating PSA checking urinalysis.  We will go ahead and send to urology as well.

## 2022-01-13 NOTE — Assessment & Plan Note (Signed)
Given handout for home exercises.  Adding steroid taper.  If not improving we can consider shoulder injection and/or referral to PT.

## 2022-02-06 ENCOUNTER — Telehealth: Payer: Self-pay | Admitting: Family Medicine

## 2022-02-06 NOTE — Telephone Encounter (Signed)
Any previous intolerance to statins?  Injections are only indicated in having prior intolerance to statins.

## 2022-02-06 NOTE — Telephone Encounter (Signed)
Patient called he would like to start getting cholesterol shot instead of taken the pill form

## 2022-02-26 ENCOUNTER — Encounter: Payer: Self-pay | Admitting: Urology

## 2022-02-26 ENCOUNTER — Ambulatory Visit (INDEPENDENT_AMBULATORY_CARE_PROVIDER_SITE_OTHER): Payer: 59 | Admitting: Urology

## 2022-02-26 VITALS — BP 159/87 | HR 78 | Ht 67.0 in | Wt 200.0 lb

## 2022-02-26 DIAGNOSIS — R350 Frequency of micturition: Secondary | ICD-10-CM

## 2022-02-26 DIAGNOSIS — R351 Nocturia: Secondary | ICD-10-CM

## 2022-02-26 LAB — URINALYSIS
Bilirubin, UA: NEGATIVE
Blood, UA: NEGATIVE
Glucose, UA: NEGATIVE mg/dL
Ketones, UA: NEGATIVE
Leukocytes, UA: NEGATIVE
Nitrite, UA: NEGATIVE
Protein, UA: NEGATIVE
Spec Grav, UA: >=1.03 — AB (ref 1.010–1.025)
Urobilinogen, UA: 0.2 E.U./dL
pH, UA: 6.5 (ref 5.0–8.0)

## 2022-02-26 LAB — BLADDER SCAN AMB NON-IMAGING

## 2022-02-26 MED ORDER — MIRABEGRON ER 25 MG PO TB24: 25.0000 mg | ORAL_TABLET | Freq: Every day | ORAL | 0 refills | Status: DC

## 2022-02-26 NOTE — Progress Notes (Signed)
post void residual =25mL 

## 2022-02-26 NOTE — Progress Notes (Signed)
Assessment: 1. Nocturia   2. Urinary frequency     Plan: I reviewed the patient's chart including provider notes and lab results. Options for management of his lower urinary tract symptoms discussed.   Discussed avoidance of dietary irritants.  Bladder diet sheet provided. Trial of Myrbetriq 25 mg daily.  Samples provided.  Use and side effects discussed. Return to office in 1 month.    Chief Complaint:  Chief Complaint  Patient presents with   Nocturia    History of Present Illness:  Spencer Wang is a 66 y.o. male who is seen in consultation from Everrett Coombe, DO for evaluation of nocturia and urinary frequency.  He has had urinary symptoms for a number of years.  He has previously been evaluated by urology while living in [place].  He underwent evaluation with cystoscopy as well as other testing which he reports was normal.  No records available for review.  He currently reports symptoms of frequency, voiding every 2 hours, urgency, and nocturia 2-3 times.  He does have some hesitancy and decreased stream primarily at night.  He notices some dribbling if he attempts to postpone urination.  No dysuria or gross hematuria.  No history of UTIs or prostatitis.  He previously tried tamsulosin without improvement in his symptoms.  He does have a history of sleep apnea but is not currently using a CPAP mask. IPSS = 15 QOL = 3/6  PSA results: 8/20 2.7 10/21 2.25 9/22 2.27 10/23 3.46   Past Medical History:  Past Medical History:  Diagnosis Date   Hyperlipidemia    Hypertension     Past Surgical History:  Past Surgical History:  Procedure Laterality Date   APPENDECTOMY      Allergies:  No Known Allergies  Family History:  Family History  Problem Relation Age of Onset   Hypertension Father    Hyperlipidemia Father     Social History:  Social History   Tobacco Use   Smoking status: Former    Types: Cigarettes    Quit date: 03/19/1995    Years since quitting:  26.9   Smokeless tobacco: Never  Substance Use Topics   Alcohol use: Yes    Alcohol/week: 0.0 standard drinks of alcohol   Drug use: No    Review of symptoms:  Constitutional:  Negative for unexplained weight loss, night sweats, fever, chills ENT:  Negative for nose bleeds, sinus pain, painful swallowing CV:  Negative for chest pain, shortness of breath, exercise intolerance, palpitations, loss of consciousness Resp:  Negative for cough, wheezing, shortness of breath GI:  Negative for nausea, vomiting, diarrhea, bloody stools GU:  Positives noted in HPI; otherwise negative for gross hematuria, dysuria Neuro:  Negative for seizures, poor balance, limb weakness, slurred speech Psych:  Negative for lack of energy, depression, anxiety Endocrine:  Negative for polydipsia, polyuria, symptoms of hypoglycemia (dizziness, hunger, sweating) Hematologic:  Negative for anemia, purpura, petechia, prolonged or excessive bleeding, use of anticoagulants  Allergic:  Negative for difficulty breathing or choking as a result of exposure to anything; no shellfish allergy; no allergic response (rash/itch) to materials, foods  Physical exam: BP (!) 159/87   Pulse 78   Ht 5\' 7"  (1.702 m)   Wt 200 lb (90.7 kg)   BMI 31.32 kg/m  GENERAL APPEARANCE:  Well appearing, well developed, well nourished, NAD HEENT: Atraumatic, Normocephalic, oropharynx clear. NECK: Supple without lymphadenopathy or thyromegaly. LUNGS: Clear to auscultation bilaterally. HEART: Regular Rate and Rhythm without murmurs, gallops, or rubs. ABDOMEN:  Soft, non-tender, No Masses.  No obvious hernia palpated. EXTREMITIES: Moves all extremities well.  Without clubbing, cyanosis, or edema. NEUROLOGIC:  Alert and oriented x 3, normal gait, CN II-XII grossly intact.  MENTAL STATUS:  Appropriate. BACK:  Non-tender to palpation.  No CVAT SKIN:  Warm, dry and intact.   GU: Penis:  uncircumcised Meatus: Normal Scrotum: normal, no  masses Testis: normal without masses bilateral Epididymis: normal Prostate: 40 g, NT, difficult to feel base Rectum: Normal tone,  no masses or tenderness   Results: Results for orders placed or performed in visit on 02/26/22 (from the past 24 hour(s))  Urinalysis     Status: Abnormal   Collection Time: 02/26/22 12:00 AM  Result Value Ref Range   Glucose, UA negative negative mg/dL   Bilirubin, UA NEGATIVE    Ketones, UA NEGATIVE    Spec Grav, UA >=1.030 (A) 1.010 - 1.025   Blood, UA NEGATIVE    pH, UA 6.5 5.0 - 8.0   Protein, UA Negative Negative   Urobilinogen, UA 0.2 0.2 or 1.0 E.U./dL   Nitrite, UA Negative    Leukocytes, UA Negative Negative     PVR = 25 ml

## 2022-04-02 ENCOUNTER — Ambulatory Visit (INDEPENDENT_AMBULATORY_CARE_PROVIDER_SITE_OTHER): Payer: 59 | Admitting: Urology

## 2022-04-02 ENCOUNTER — Telehealth: Payer: Self-pay

## 2022-04-02 ENCOUNTER — Encounter: Payer: Self-pay | Admitting: Urology

## 2022-04-02 VITALS — BP 146/85 | HR 69 | Ht 67.72 in | Wt 200.0 lb

## 2022-04-02 DIAGNOSIS — R350 Frequency of micturition: Secondary | ICD-10-CM

## 2022-04-02 DIAGNOSIS — R351 Nocturia: Secondary | ICD-10-CM | POA: Diagnosis not present

## 2022-04-02 LAB — URINALYSIS
Bilirubin, UA: NEGATIVE
Blood, UA: NEGATIVE
Glucose, UA: NEGATIVE mg/dL
Ketones, POC UA: NEGATIVE mg/dL
Leukocytes, UA: NEGATIVE
Nitrite, UA: NEGATIVE
Protein Ur, POC: NEGATIVE mg/dL
Spec Grav, UA: 1.025 (ref 1.010–1.025)
Urobilinogen, UA: 0.2 E.U./dL
pH, UA: 6 (ref 5.0–8.0)

## 2022-04-02 MED ORDER — MIRABEGRON ER 50 MG PO TB24: 50.0000 mg | ORAL_TABLET | Freq: Every day | ORAL | 0 refills | Status: DC

## 2022-04-02 NOTE — Progress Notes (Signed)
   Assessment: 1. Nocturia   2. Urinary frequency     Plan: Continue bladder diet Trial of Myrbetriq 50 mg daily.  Samples provided.   24 hour voiding diary x 3 days Return to office in 1 month.    Chief Complaint:  Chief Complaint  Patient presents with   Nocturia    History of Present Illness:  Spencer Wang is a 67 y.o. male who is seen for continued evaluation of nocturia and urinary frequency.  He has had urinary symptoms for a number of years.  He has previously been evaluated by urology while living in [place].  He underwent evaluation with cystoscopy as well as other testing which he reports was normal.  No records available for review.  He has had symptoms of frequency, voiding every 2 hours, urgency, and nocturia 2-3 times.  He also reported some hesitancy and decreased stream, primarily at night.  He notices some dribbling if he attempts to postpone urination.  No dysuria or gross hematuria.  No history of UTIs or prostatitis.  He previously tried tamsulosin without improvement in his symptoms.  He does have a history of sleep apnea but is not currently using a CPAP mask. IPSS = 15 QOL = 3/6 PVR = 25 ml.  PSA results: 8/20 2.7 10/21 2.25 9/22 2.27 10/23 3.46  He was given a trial of Myrbetriq 25 mg daily in 12/23.  He returns today for follow-up.  He initially noted some improvement with the Myrbetriq but the benefit decreased after several days.  He continues to have nocturia, frequency, and urgency.  No dysuria or gross hematuria. IPSS = 18 QOL = 2/6  Portions of the above documentation were copied from a prior visit for review purposes only.  Past Medical History:  Past Medical History:  Diagnosis Date   Hyperlipidemia    Hypertension     Past Surgical History:  Past Surgical History:  Procedure Laterality Date   APPENDECTOMY      Allergies:  No Known Allergies  Family History:  Family History  Problem Relation Age of Onset   Hypertension Father     Hyperlipidemia Father     Social History:  Social History   Tobacco Use   Smoking status: Former    Types: Cigarettes    Quit date: 03/19/1995    Years since quitting: 27.0   Smokeless tobacco: Never  Substance Use Topics   Alcohol use: Yes    Alcohol/week: 0.0 standard drinks of alcohol   Drug use: No    ROS: Constitutional:  Negative for fever, chills, weight loss CV: Negative for chest pain, previous MI, hypertension Respiratory:  Negative for shortness of breath, wheezing, sleep apnea, frequent cough GI:  Negative for nausea, vomiting, bloody stool, GERD  Physical exam: BP (!) 146/85   Pulse 69   Ht 5' 7.72" (1.72 m)   Wt 200 lb (90.7 kg)   BMI 30.66 kg/m  GENERAL APPEARANCE:  Well appearing, well developed, well nourished, NAD HEENT:  Atraumatic, normocephalic, oropharynx clear NECK:  Supple without lymphadenopathy or thyromegaly ABDOMEN:  Soft, non-tender, no masses EXTREMITIES:  Moves all extremities well, without clubbing, cyanosis, or edema NEUROLOGIC:  Alert and oriented x 3, normal gait, CN II-XII grossly intact MENTAL STATUS:  appropriate BACK:  Non-tender to palpation, No CVAT SKIN:  Warm, dry, and intact   Results: U/A dipstick:  negative

## 2022-04-10 NOTE — Telephone Encounter (Signed)
Attempted to contact the patient. No answer. LVM for call back.

## 2022-05-06 ENCOUNTER — Ambulatory Visit (INDEPENDENT_AMBULATORY_CARE_PROVIDER_SITE_OTHER): Payer: 59 | Admitting: Urology

## 2022-05-06 ENCOUNTER — Encounter: Payer: Self-pay | Admitting: Urology

## 2022-05-06 VITALS — BP 137/83 | HR 81 | Ht 69.0 in | Wt 205.0 lb

## 2022-05-06 DIAGNOSIS — R351 Nocturia: Secondary | ICD-10-CM

## 2022-05-06 DIAGNOSIS — R350 Frequency of micturition: Secondary | ICD-10-CM | POA: Diagnosis not present

## 2022-05-06 LAB — URINALYSIS
Bilirubin, UA: NEGATIVE
Blood, UA: NEGATIVE
Glucose, UA: NEGATIVE mg/dL
Leukocytes, UA: NEGATIVE
Nitrite, UA: NEGATIVE
Protein Ur, POC: NEGATIVE mg/dL
Spec Grav, UA: 1.025 (ref 1.010–1.025)
Urobilinogen, UA: 0.2 E.U./dL
pH, UA: 5.5 (ref 5.0–8.0)

## 2022-05-06 MED ORDER — SOLIFENACIN SUCCINATE 5 MG PO TABS: 5.0000 mg | ORAL_TABLET | Freq: Every day | ORAL | 11 refills | Status: DC

## 2022-05-06 NOTE — Patient Instructions (Signed)
.  aur

## 2022-05-06 NOTE — Progress Notes (Addendum)
Assessment: 1. Urinary frequency   2. Nocturia     Plan: His lower urinary tract symptoms remain poorly controlled with current medical management. Continue bladder diet Trial of Vesicare 5 mg daily.  Rx sent. Call with results of medication in 1 month. Discussed further evaluation with cystoscopy and urodynamics  Chief Complaint:  Chief Complaint  Patient presents with   Nocturia    History of Present Illness:  Spencer Wang is a 67 y.o. male who is seen for continued evaluation of nocturia and urinary frequency.  He has had urinary symptoms for a number of years.  He has previously been evaluated by urology while living in [place].  He underwent evaluation with cystoscopy as well as other testing which he reports was normal.  No records available for review.  He has had symptoms of frequency, voiding every 2 hours, urgency, and nocturia 2-3 times.  He also reported some hesitancy and decreased stream, primarily at night.  He notices some dribbling if he attempts to postpone urination.  No dysuria or gross hematuria.  No history of UTIs or prostatitis.  He previously tried tamsulosin without improvement in his symptoms.  He does have a history of sleep apnea but is not currently using a CPAP mask. IPSS = 15 QOL = 3/6 PVR = 25 ml.  PSA results: 8/20 2.7 10/21 2.25 9/22 2.27 10/23 3.46  He was given a trial of Myrbetriq 25 mg daily in 12/23. At his visit in 1/24, he reported some improvement with the Myrbetriq but the benefit decreased after several days.  He continued to have nocturia, frequency, and urgency.  No dysuria or gross hematuria. IPSS = 18 QOL = 2/6 He was started on Myrbetriq 50 mg daily.  He returns today for follow-up.  He continues on Myrbetriq 50 mg daily.  He continues to have daytime frequency, urgency, and nocturia x 3.  He does not feel like the Myrbetriq has improved his symptoms.  No dysuria or gross hematuria. IPSS = 16 QOL = 3/6   Review of 24-hour  voiding diary shows small volume output with each void (<200 mL), nocturnal output of 31% and 22% recorded.  Portions of the above documentation were copied from a prior visit for review purposes only.  Past Medical History:  Past Medical History:  Diagnosis Date   Hyperlipidemia    Hypertension     Past Surgical History:  Past Surgical History:  Procedure Laterality Date   APPENDECTOMY      Allergies:  No Known Allergies  Family History:  Family History  Problem Relation Age of Onset   Hypertension Father    Hyperlipidemia Father     Social History:  Social History   Tobacco Use   Smoking status: Former    Types: Cigarettes    Quit date: 03/19/1995    Years since quitting: 27.1   Smokeless tobacco: Never  Substance Use Topics   Alcohol use: Yes    Alcohol/week: 0.0 standard drinks of alcohol   Drug use: No    ROS: Constitutional:  Negative for fever, chills, weight loss CV: Negative for chest pain, previous MI, hypertension Respiratory:  Negative for shortness of breath, wheezing, sleep apnea, frequent cough GI:  Negative for nausea, vomiting, bloody stool, GERD  Physical exam: BP 137/83   Pulse 81   Ht 5' 9"$  (1.753 m)   Wt 205 lb (93 kg)   BMI 30.27 kg/m  GENERAL APPEARANCE:  Well appearing, well developed, well nourished, NAD HEENT:  Atraumatic, normocephalic, oropharynx clear NECK:  Supple without lymphadenopathy or thyromegaly ABDOMEN:  Soft, non-tender, no masses EXTREMITIES:  Moves all extremities well, without clubbing, cyanosis, or edema NEUROLOGIC:  Alert and oriented x 3, normal gait, CN II-XII grossly intact MENTAL STATUS:  appropriate BACK:  Non-tender to palpation, No CVAT SKIN:  Warm, dry, and intact   Results: U/A dipstick: trace ketones

## 2022-06-09 ENCOUNTER — Other Ambulatory Visit: Payer: Self-pay | Admitting: Family Medicine

## 2022-06-09 DIAGNOSIS — G4700 Insomnia, unspecified: Secondary | ICD-10-CM

## 2022-10-01 ENCOUNTER — Ambulatory Visit (INDEPENDENT_AMBULATORY_CARE_PROVIDER_SITE_OTHER): Payer: 59 | Admitting: Family Medicine

## 2022-10-01 ENCOUNTER — Encounter: Payer: Self-pay | Admitting: Family Medicine

## 2022-10-01 VITALS — BP 116/72 | HR 70 | Ht 69.0 in | Wt 216.0 lb

## 2022-10-01 DIAGNOSIS — H6993 Unspecified Eustachian tube disorder, bilateral: Secondary | ICD-10-CM | POA: Diagnosis not present

## 2022-10-01 DIAGNOSIS — D485 Neoplasm of uncertain behavior of skin: Secondary | ICD-10-CM | POA: Diagnosis not present

## 2022-10-01 DIAGNOSIS — H6990 Unspecified Eustachian tube disorder, unspecified ear: Secondary | ICD-10-CM | POA: Insufficient documentation

## 2022-10-01 DIAGNOSIS — D171 Benign lipomatous neoplasm of skin and subcutaneous tissue of trunk: Secondary | ICD-10-CM | POA: Diagnosis not present

## 2022-10-01 NOTE — Assessment & Plan Note (Signed)
Area with appearance of lipoma.  He does have some small areas on the arm as well.  Referral to dermatology for this as well as pigmented lesion.

## 2022-10-01 NOTE — Progress Notes (Signed)
Spencer Wang - 67 y.o. male MRN [id number]  Date of birth: 03-Jan-1956  Subjective Chief Complaint  Patient presents with   Hearing Problem    HPI Spencer Wang is a 67 year old male here today with complaint of feeling of ear fullness.  He flew back from [place] few weeks ago and developed a cold shortly after.  He had quite a bit of congestion and cough with this.  This is pretty much resolved at this point however his ear fullness persists.  He has not tried thing so far except a flush to help with wax.  He denies any ear pain or dizziness.  He does have a lipoma on his back that has become more itchy.  He is interested in having this removed.  Also has a pigmented lesion with irregular borders and coloration just below this.  ROS:  A comprehensive ROS was completed and negative except as noted per HPI    No Known Allergies  Past Medical History:  Diagnosis Date   Hyperlipidemia    Hypertension     Past Surgical History:  Procedure Laterality Date   APPENDECTOMY      Social History   Socioeconomic History   Marital status: Married    Spouse name: Not on file   Number of children: Not on file   Years of education: Not on file   Highest education level: Not on file  Occupational History   Not on file  Tobacco Use   Smoking status: Former    Current packs/day: 0.00    Types: Cigarettes    Quit date: 03/19/1995    Years since quitting: 27.5   Smokeless tobacco: Never  Substance and Sexual Activity   Alcohol use: Yes    Alcohol/week: 0.0 standard drinks of alcohol   Drug use: No   Sexual activity: Yes    Partners: Female  Other Topics Concern   Not on file  Social History Narrative   Not on file   Social Determinants of Health   Financial Resource Strain: Not on file  Food Insecurity: Not on file  Transportation Needs: Not on file  Physical Activity: Not on file  Stress: Not on file  Social Connections: Unknown (07/30/2021)   [facility]   Social Network    Social Network: Not on file    Family History  Problem Relation Age of Onset   Hypertension Father    Hyperlipidemia Father     Health Maintenance  Topic Date Due   COVID-19 Vaccine (3 - 2023-24 season) 12/30/2022 (Originally 11/16/2021)   Pneumonia Vaccine 65+ Years old (1 of 1 - PCV) 01/09/2023 (Originally 12/16/2020)   INFLUENZA VACCINE  10/17/2022   Colonoscopy  05/31/2024   DTaP/Tdap/Td (2 - Td or Tdap) 01/22/2025   Hepatitis C Screening  Completed   Zoster Vaccines- Shingrix  Completed   HPV VACCINES  Aged Out     ----------------------------------------------------------------------------------------------------------------------------------------------------------------------------------------------------------------- Physical Exam BP 116/72 (BP Location: Left Arm, Patient Position: Sitting, Cuff Size: Large)   Pulse 70   Ht 5\' 9"  (1.753 m)   Wt 216 lb (98 kg)   SpO2 96%   BMI 31.90 kg/m   Physical Exam Constitutional:      Appearance: Normal appearance.  HENT:     Head: Normocephalic and atraumatic.     Ears:     Comments: Serous effusion of bilateral ears. Eyes:     General: No scleral icterus. Cardiovascular:     Rate and Rhythm: Normal  rate and regular rhythm.  Pulmonary:     Effort: Pulmonary effort is normal.     Breath sounds: Normal breath sounds.  Musculoskeletal:     Cervical back: Neck supple.  Skin:    Comments: Subcutaneous mass located adjacent to the right scapula, soft without tenderness.  There is a pigmented lesion with irregular borders just below mass.  Neurological:     Mental Status: He is alert.     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  He does  have some small areas on the arm as well.  Referral to dermatology for this as well as pigmented lesion.  Eustachian tube dysfunction Recommend using Flonase for couple weeks.  Additionally he can use Sudafed for a few days to help with drying of fluid.   No orders of the defined types were placed in this encounter.   No follow-ups on file.    This visit occurred during the SARS-CoV-2 public health emergency.  Safety protocols were in place, including screening questions prior to the visit, additional usage of staff PPE, and extensive cleaning of exam room while observing appropriate contact time as indicated for disinfecting solutions.

## 2022-10-01 NOTE — Assessment & Plan Note (Signed)
Recommend using Flonase for couple weeks.  Additionally he can use Sudafed for a few days to help with drying of fluid.

## 2022-11-26 ENCOUNTER — Other Ambulatory Visit: Payer: Self-pay | Admitting: Family Medicine

## 2022-11-26 DIAGNOSIS — G4700 Insomnia, unspecified: Secondary | ICD-10-CM

## 2022-11-27 ENCOUNTER — Encounter: Payer: Self-pay | Admitting: Family Medicine

## 2022-11-27 ENCOUNTER — Telehealth: Payer: Self-pay | Admitting: Family Medicine

## 2022-11-27 DIAGNOSIS — G4700 Insomnia, unspecified: Secondary | ICD-10-CM

## 2022-11-27 MED ORDER — ZOLPIDEM TARTRATE 10 MG PO TABS
ORAL_TABLET | ORAL | 2 refills | Status: DC
Start: 2022-11-27 — End: 2023-05-07

## 2022-11-27 NOTE — Telephone Encounter (Signed)
Prescription Request  11/27/2022  LOV: 10/01/2022  What is the name of the medication or equipment? zolpidem (AMBIEN) 10 MG tablet   Have you contacted your pharmacy to request a refill? Yes   Which pharmacy would you like this sent to?   [facility] [phone] - [place], [place] - [address] [place] [address] Phone: [phone] Fax: [phone]    Patient notified that their request is being sent to the clinical staff for review and that they should receive a response within 2 business days.   Please advise at [facility] [phone]

## 2022-11-27 NOTE — Telephone Encounter (Signed)
Zolipem 10mg  Last writtten 06/13/2022 Last OV 10/01/2022 No upcoming appt schld.

## 2023-03-13 ENCOUNTER — Other Ambulatory Visit: Payer: Self-pay | Admitting: Family Medicine

## 2023-05-02 ENCOUNTER — Other Ambulatory Visit: Payer: Self-pay | Admitting: Family Medicine

## 2023-05-02 DIAGNOSIS — G4700 Insomnia, unspecified: Secondary | ICD-10-CM

## 2023-06-30 ENCOUNTER — Ambulatory Visit: Payer: Self-pay

## 2023-06-30 NOTE — Telephone Encounter (Signed)
  Chief Complaint: dizziness Symptoms: dizziness Frequency: on and off over the weekend Pertinent Negatives: Patient denies sob, chest pain, LOC Disposition: [] ED /[] Urgent Care (no appt availability in office) / [x] Appointment(In office/virtual)/ []  [facility]/ [] Home Care/ [] Refused Recommended Disposition /[] [facility]/ []  Follow-up with PCP Additional Notes: Patient reports over the weekend he has been having dizzy spells on and off that range from moderate to severe. Patient reports he has had minor episodes of dizziness in the past, but never this bad. Patient reports it feels like his head is spinning during this episodes and it can become difficult for him to walk correctly. Patient denies all other symptoms. Per protocol, appt scheduled in office tomorrow next available 07/01/23. Patient advised to call back with worsening symptoms. Patient verbalized understanding.     Copied from CRM [id number]. Topic: Clinical - Red Word Triage >> Jun 30, 2023  2:31 PM Bearl Botts A wrote: [facility] that prompted transfer to Nurse Triage: Patient having dizzy spells, head spinning. Reason for Disposition  [1] MODERATE dizziness (e.g., interferes with normal activities) AND [2] has NOT been evaluated by doctor (or NP/PA) for this  (Exception: Dizziness caused by heat exposure, sudden standing, or poor fluid intake.)  Answer Assessment - Initial Assessment Questions 1. DESCRIPTION: "Describe your dizziness."     Head spinning 2. LIGHTHEADED: "Do you feel lightheaded?" (e.g., somewhat faint, woozy, weak upon standing)     yes 3. VERTIGO: "Do you feel like either you or the room is spinning or tilting?" (i.e. vertigo)     Head spinning 4. SEVERITY: "How bad is it?"  "Do you feel like you are going to faint?" "Can you stand and walk?"   - MILD: Feels slightly dizzy, but walking normally.   - MODERATE: Feels unsteady when walking, but not falling; interferes with normal activities  (e.g., school, work).   - SEVERE: Unable to walk without falling, or requires assistance to walk without falling; feels like passing out now.    Moderate -  severe 5. ONSET:  "When did the dizziness begin?"     Over the weekend, comes and goes 6. AGGRAVATING FACTORS: "Does anything make it worse?" (e.g., standing, change in head position)     Standing 7. HEART RATE: "Can you tell me your heart rate?" "How many beats in 15 seconds?"  (Note: not all patients can do this)       unsure 8. CAUSE: "What do you think is causing the dizziness?"     unsure 9. RECURRENT SYMPTOM: "Have you had dizziness before?" If Yes, ask: "When was the last time?" "What happened that time?"     Have had issues with dizziness in the past that was mild, this is the first time it's gotten this bad 10. OTHER SYMPTOMS: "Do you have any other symptoms?" (e.g., fever, chest pain, vomiting, diarrhea, bleeding)       none  Protocols used: Dizziness - Lightheadedness-A-AH

## 2023-07-01 ENCOUNTER — Encounter: Payer: Self-pay | Admitting: Family Medicine

## 2023-07-01 ENCOUNTER — Ambulatory Visit (INDEPENDENT_AMBULATORY_CARE_PROVIDER_SITE_OTHER): Admitting: Family Medicine

## 2023-07-01 VITALS — BP 128/76 | HR 74 | Ht 69.0 in | Wt 219.1 lb

## 2023-07-01 DIAGNOSIS — R42 Dizziness and giddiness: Secondary | ICD-10-CM | POA: Diagnosis not present

## 2023-07-01 MED ORDER — MECLIZINE HCL 25 MG PO TABS: 25.0000 mg | ORAL_TABLET | Freq: Three times a day (TID) | ORAL | 0 refills | Status: DC | PRN

## 2023-07-01 NOTE — Assessment & Plan Note (Signed)
 Symptoms and exam are consistent with vertigo.  Overall his symptoms have improved quite a bit since onset a few days ago.  He does have small amount of fluid behind the right TM.  Recommend adding Flonase daily.  Meclizine as needed.  Red flags reviewed.  Contact clinic if not improving.

## 2023-07-01 NOTE — Patient Instructions (Signed)
 Use flonase daily for the next couple of weeks Meclizine as needed.  Let me know if worsening.     Eustachian Tube Dysfunction  Eustachian tube dysfunction refers to a condition in which a blockage develops in the narrow passage that connects the middle ear to the back of the nose (eustachian tube). The eustachian tube regulates air pressure in the middle ear by letting air move between the ear and nose. It also helps to drain fluid from the middle ear space. Eustachian tube dysfunction can affect one or both ears. When the eustachian tube does not function properly, air pressure, fluid, or both can build up in the middle ear. What are the causes? This condition occurs when the eustachian tube becomes blocked or cannot open normally. Common causes of this condition include: Ear infections. Colds and other infections that affect the nose, mouth, and throat (upper respiratory tract). Allergies. Irritation from cigarette smoke. Irritation from stomach acid coming up into the esophagus (gastroesophageal reflux). The esophagus is the part of the body that moves food from the mouth to the stomach. Sudden changes in air pressure, such as from descending in an airplane or scuba diving. Abnormal growths in the nose or throat, such as: Growths that line the nose (nasal polyps). Abnormal growth of cells (tumors). Enlarged tissue at the back of the throat (adenoids). What increases the risk? You are more likely to develop this condition if: You smoke. You are overweight. You are a child who has: Certain birth defects of the mouth, such as cleft palate. Large tonsils or adenoids. What are the signs or symptoms? Common symptoms of this condition include: A feeling of fullness in the ear. Ear pain. Clicking or popping noises in the ear. Ringing in the ear (tinnitus). Hearing loss. Loss of balance. Dizziness. Symptoms may get worse when the air pressure around you changes, such as when you  travel to an area of high elevation, fly on an airplane, or go scuba diving. How is this diagnosed? This condition may be diagnosed based on: Your symptoms. A physical exam of your ears, nose, and throat. Tests, such as those that measure: The movement of your eardrum. Your hearing (audiometry). How is this treated? Treatment depends on the cause and severity of your condition. In mild cases, you may relieve your symptoms by moving air into your ears. This is called "popping the ears." In more severe cases, or if you have symptoms of fluid in your ears, treatment may include: Medicines to relieve congestion (decongestants). Medicines that treat allergies (antihistamines). Nasal sprays or ear drops that contain medicines that reduce swelling (steroids). A procedure to drain the fluid in your eardrum. In this procedure, a small tube may be placed in the eardrum to: Drain the fluid. Restore the air in the middle ear space. A procedure to insert a balloon device through the nose to inflate the opening of the eustachian tube (balloon dilation). Follow these instructions at home: Lifestyle Do not do any of the following until your health care provider approves: Travel to high altitudes. Fly in airplanes. Work in a Estate agent or room. Scuba dive. Do not use any products that contain nicotine or tobacco. These products include cigarettes, chewing tobacco, and vaping devices, such as e-cigarettes. If you need help quitting, ask your health care provider. Keep your ears dry. Wear fitted earplugs during showering and bathing. Dry your ears completely after. General instructions Take over-the-counter and prescription medicines only as told by your health care provider. Use  techniques to help pop your ears as recommended by your health care provider. These may include: Chewing gum. Yawning. Frequent, forceful swallowing. Closing your mouth, holding your nose closed, and gently blowing as  if you are trying to blow air out of your nose. Keep all follow-up visits. This is important. Contact a health care provider if: Your symptoms do not go away after treatment. Your symptoms come back after treatment. You are unable to pop your ears. You have: A fever. Pain in your ear. Pain in your head or neck. Fluid draining from your ear. Your hearing suddenly changes. You become very dizzy. You lose your balance. Get help right away if: You have a sudden, severe increase in any of your symptoms. Summary Eustachian tube dysfunction refers to a condition in which a blockage develops in the eustachian tube. It can be caused by ear infections, allergies, inhaled irritants, or abnormal growths in the nose or throat. Symptoms may include ear pain or fullness, hearing loss, or ringing in the ears. Mild cases are treated with techniques to unblock the ears, such as yawning or chewing gum. More severe cases are treated with medicines or procedures. This information is not intended to replace advice given to you by your health care provider. Make sure you discuss any questions you have with your health care provider. Document Revised: 05/15/2020 Document Reviewed: 05/15/2020 Elsevier Patient Education  2024 [employer].

## 2023-07-01 NOTE — Progress Notes (Signed)
 Spencer Wang - 68 y.o. male MRN [id number]  Date of birth: [birth year]  Subjective Chief Complaint  Patient presents with   Dizziness    HPI Spencer Wang is a 68 y.o. male here today with complaint of dizziness.  Reports dizziness when leaning over or moving his head too fast.  He has had this for a few days.  Has had some mild congestion and right-sided ear pressure.  He has not had palpitations, nausea, other neurological symptoms or headaches.  He has not tried anything to help with dizziness so far.  He did have an episode of this a few years ago and had an MRI which was normal.  Symptoms have improved to near resolution at this point.    ROS:  A comprehensive ROS was completed and negative except as noted per HPI      No Known Allergies  Past Medical History:  Diagnosis Date   Hyperlipidemia    Hypertension     Past Surgical History:  Procedure Laterality Date   APPENDECTOMY      Social History   Socioeconomic History   Marital status: Married    Spouse name: Not on file   Number of children: Not on file   Years of education: Not on file   Highest education level: Not on file  Occupational History   Not on file  Tobacco Use   Smoking status: Former    Current packs/day: 0.00    Types: Cigarettes    Quit date: 03/19/1995    Years since quitting: 28.3   Smokeless tobacco: Never  Substance and Sexual Activity   Alcohol use: Yes    Alcohol/week: 0.0 standard drinks of alcohol   Drug use: No   Sexual activity: Yes    Partners: Female  Other Topics Concern   Not on file  Social History Narrative   Not on file   Social Drivers of Health   Financial Resource Strain: Not on file  Food Insecurity: Not on file  Transportation Needs: Not on file  Physical Activity: Not on file  Stress: Not on file  Social Connections: Unknown (07/30/2021)   Received from [facility], [facility]   Social Network    Social Network: Not on file    Family History   Problem Relation Age of Onset   Hypertension Father    Hyperlipidemia Father     Health Maintenance  Topic Date Due   Pneumonia Vaccine 73+ Years old (1 of 1 - PCV) Never done   COVID-19 Vaccine (3 - 2024-25 season) 11/17/2022   INFLUENZA VACCINE  10/17/2023   Colonoscopy  05/31/2024   DTaP/Tdap/Td (2 - Td or Tdap) 01/22/2025   Hepatitis C Screening  Completed   Zoster Vaccines- Shingrix  Completed   HPV VACCINES  Aged Out   Meningococcal B Vaccine  Aged Out     ----------------------------------------------------------------------------------------------------------------------------------------------------------------------------------------------------------------- Physical Exam BP 128/76   Pulse 74   Ht 5\' 9"  (1.753 m)   Wt 219 lb 1.6 oz (99.4 kg)   SpO2 98%   BMI 32.36 kg/m   Physical Exam Constitutional:      Appearance: Normal appearance.  HENT:     Head: Normocephalic and atraumatic.     Right Ear: Tympanic membrane normal.     Ears:     Comments: Right serous effusion. Eyes:     General: No scleral icterus. Cardiovascular:     Rate and Rhythm: Normal rate and regular rhythm.  Pulmonary:     Effort:  Pulmonary effort is normal.     Breath sounds: Normal breath sounds.  Neurological:     General: No focal deficit present.     Mental Status: He is alert and oriented to person, place, and time.     Cranial Nerves: No cranial nerve deficit.     Motor: No weakness.     Gait: Gait normal.     Comments: Positive Dix-Hallpike to the right.  Psychiatric:        Mood and Affect: Mood normal.        Behavior: Behavior normal.     ------------------------------------------------------------------------------------------------------------------------------------------------------------------------------------------------------------------- Assessment and Plan  Vertigo Symptoms and exam are consistent with vertigo.  Overall his symptoms have improved quite a  bit since onset a few days ago.  He does have small amount of fluid behind the right TM.  Recommend adding Flonase daily.  Meclizine as needed.  Red flags reviewed.  Contact clinic if not improving.   Meds ordered this encounter  Medications   meclizine (ANTIVERT) 25 MG tablet    Sig: Take 1 tablet (25 mg total) by mouth 3 (three) times daily as needed for dizziness.    Dispense:  30 tablet    Refill:  0    No follow-ups on file.

## 2023-07-03 ENCOUNTER — Ambulatory Visit (INDEPENDENT_AMBULATORY_CARE_PROVIDER_SITE_OTHER): Admitting: Family Medicine

## 2023-07-03 ENCOUNTER — Encounter: Payer: Self-pay | Admitting: Family Medicine

## 2023-07-03 VITALS — BP 145/73 | HR 78 | Wt 219.0 lb

## 2023-07-03 DIAGNOSIS — I10 Essential (primary) hypertension: Secondary | ICD-10-CM | POA: Diagnosis not present

## 2023-07-03 DIAGNOSIS — R351 Nocturia: Secondary | ICD-10-CM | POA: Diagnosis not present

## 2023-07-03 DIAGNOSIS — M2550 Pain in unspecified joint: Secondary | ICD-10-CM

## 2023-07-03 DIAGNOSIS — E559 Vitamin D deficiency, unspecified: Secondary | ICD-10-CM | POA: Diagnosis not present

## 2023-07-03 DIAGNOSIS — Z0000 Encounter for general adult medical examination without abnormal findings: Secondary | ICD-10-CM

## 2023-07-03 DIAGNOSIS — E785 Hyperlipidemia, unspecified: Secondary | ICD-10-CM

## 2023-07-03 NOTE — Progress Notes (Signed)
 Spencer Wang - 68 y.o. male MRN [id number]  Date of birth: [birth year]  Subjective Chief Complaint  Patient presents with   Annual Exam    HPI Spencer Wang is a 67 y.o. male here today for annual exam.  Overall he is doing well.  Chronic problems are stable.   He is moderately active.  He feels that his diet is good  He is having some increased joint pain in bilateral hands, hips and knees.  He feels that hands are swollen in the morning and improves throughout the day.   He is a non-smoker. No EtOH use at this time.   Review of Systems  Constitutional:  Negative for chills, fever, malaise/fatigue and weight loss.  HENT:  Negative for congestion, ear pain and sore throat.   Eyes:  Negative for blurred vision, double vision and pain.  Respiratory:  Negative for cough and shortness of breath.   Cardiovascular:  Negative for chest pain and palpitations.  Gastrointestinal:  Negative for abdominal pain, blood in stool, constipation, heartburn and nausea.  Genitourinary:  Negative for dysuria and urgency.  Musculoskeletal:  Negative for joint pain and myalgias.  Neurological:  Negative for dizziness and headaches.  Endo/Heme/Allergies:  Does not bruise/bleed easily.  Psychiatric/Behavioral:  Negative for depression. The patient is not nervous/anxious and does not have insomnia.     No Known Allergies  Past Medical History:  Diagnosis Date   Hyperlipidemia    Hypertension     Past Surgical History:  Procedure Laterality Date   APPENDECTOMY      Social History   Socioeconomic History   Marital status: Married    Spouse name: Not on file   Number of children: Not on file   Years of education: Not on file   Highest education level: Not on file  Occupational History   Not on file  Tobacco Use   Smoking status: Former    Current packs/day: 0.00    Types: Cigarettes    Quit date: 03/19/1995    Years since quitting: 28.3   Smokeless tobacco: Never  Substance and  Sexual Activity   Alcohol use: Yes    Alcohol/week: 0.0 standard drinks of alcohol   Drug use: No   Sexual activity: Yes    Partners: Female  Other Topics Concern   Not on file  Social History Narrative   Not on file   Social Drivers of Health   Financial Resource Strain: Not on file  Food Insecurity: Not on file  Transportation Needs: Not on file  Physical Activity: Not on file  Stress: Not on file  Social Connections: Unknown (07/30/2021)   Received from [facility]   Social Network    Social Network: Not on file    Family History  Problem Relation Age of Onset   Hypertension Father    Hyperlipidemia Father     Health Maintenance  Topic Date Due   Pneumonia Vaccine 33+ Years old (1 of 1 - PCV) Never done   COVID-19 Vaccine (3 - 2024-25 season) 11/17/2022   INFLUENZA VACCINE  10/17/2023   Colonoscopy  05/31/2024   DTaP/Tdap/Td (2 - Td or Tdap) 01/22/2025   Hepatitis C Screening  Completed   Zoster Vaccines- Shingrix  Completed   HPV VACCINES  Aged Out   Meningococcal B Vaccine  Aged Out     ----------------------------------------------------------------------------------------------------------------------------------------------------------------------------------------------------------------- Physical Exam BP (!) 145/73 (BP Location: Left Arm, Patient Position: Sitting, Cuff Size: Normal)   Pulse 78   Wt  219 lb (99.3 kg)   SpO2 97%   BMI 32.34 kg/m   Physical Exam Constitutional:      General: He is not in acute distress. HENT:     Head: Normocephalic and atraumatic.     Right Ear: Tympanic membrane and external ear normal.     Left Ear: Tympanic membrane and external ear normal.  Eyes:     General: No scleral icterus. Neck:     Thyroid: No thyromegaly.  Cardiovascular:     Rate and Rhythm: Normal rate and regular rhythm.     Heart sounds: Normal heart sounds.  Pulmonary:     Effort: Pulmonary effort is normal.     Breath  sounds: Normal breath sounds.  Abdominal:     General: Bowel sounds are normal. There is no distension.     Palpations: Abdomen is soft.     Tenderness: There is no abdominal tenderness. There is no guarding.  Musculoskeletal:     Cervical back: Normal range of motion.  Lymphadenopathy:     Cervical: No cervical adenopathy.  Skin:    General: Skin is warm and dry.     Findings: No rash.  Neurological:     Mental Status: He is alert and oriented to person, place, and time.     Cranial Nerves: No cranial nerve deficit.     Motor: No abnormal muscle tone.  Psychiatric:        Mood and Affect: Mood normal.        Behavior: Behavior normal.     ------------------------------------------------------------------------------------------------------------------------------------------------------------------------------------------------------------------- Assessment and Plan  Well adult exam Well adult Orders Placed This Encounter  Procedures   CMP14+EGFR   CBC with Differential/Platelet   PSA   Lipid Panel With LDL/HDL Ratio   Vitamin D  (25 hydroxy)   HgB A1c   Sed Rate (ESR)   C-reactive protein   ANA,IFA RA Diag Pnl w/rflx Tit/Patn   Uric acid  Screenings: Per lab orders Immunizations: He will consider pneumonia vaccine. Anticipatory guidance/risk factor reduction: Recommendations per AVS.   No orders of the defined types were placed in this encounter.   No follow-ups on file.

## 2023-07-03 NOTE — Patient Instructions (Signed)
 Preventive Care 73 Years and Older, Male Preventive care refers to lifestyle choices and visits with your health care provider that can promote health and wellness. Preventive care visits are also called wellness exams. What can I expect for my preventive care visit? Counseling During your preventive care visit, your health care provider may ask about your: Medical history, including: Past medical problems. Family medical history. History of falls. Current health, including: Emotional well-being. Home life and relationship well-being. Sexual activity. Memory and ability to understand (cognition). Lifestyle, including: Alcohol, nicotine or tobacco, and drug use. Access to firearms. Diet, exercise, and sleep habits. Work and work Astronomer. Sunscreen use. Safety issues such as seatbelt and bike helmet use. Physical exam Your health care provider will check your: Height and weight. These may be used to calculate your BMI (body mass index). BMI is a measurement that tells if you are at a healthy weight. Waist circumference. This measures the distance around your waistline. This measurement also tells if you are at a healthy weight and may help predict your risk of certain diseases, such as type 2 diabetes and high blood pressure. Heart rate and blood pressure. Body temperature. Skin for abnormal spots. What immunizations do I need?  Vaccines are usually given at various ages, according to a schedule. Your health care provider will recommend vaccines for you based on your age, medical history, and lifestyle or other factors, such as travel or where you work. What tests do I need? Screening Your health care provider may recommend screening tests for certain conditions. This may include: Lipid and cholesterol levels. Diabetes screening. This is done by checking your blood sugar (glucose) after you have not eaten for a while (fasting). Hepatitis C test. Hepatitis B test. HIV (human  immunodeficiency virus) test. STI (sexually transmitted infection) testing, if you are at risk. Lung cancer screening. Colorectal cancer screening. Prostate cancer screening. Abdominal aortic aneurysm (AAA) screening. You may need this if you are a current or former smoker. Talk with your health care provider about your test results, treatment options, and if necessary, the need for more tests. Follow these instructions at home: Eating and drinking  Eat a diet that includes fresh fruits and vegetables, whole grains, lean protein, and low-fat dairy products. Limit your intake of foods with high amounts of sugar, saturated fats, and salt. Take vitamin and mineral supplements as recommended by your health care provider. Do not drink alcohol if your health care provider tells you not to drink. If you drink alcohol: Limit how much you have to 0-2 drinks a day. Know how much alcohol is in your drink. In the [place] one drink equals one 12 oz bottle of beer (355 mL), one 5 oz glass of wine (148 mL), or one 1 oz glass of hard liquor (44 mL). Lifestyle Brush your teeth every morning and night with fluoride toothpaste. Floss one time each day. Exercise for at least 30 minutes 5 or more days each week. Do not use any products that contain nicotine or tobacco. These products include cigarettes, chewing tobacco, and vaping devices, such as e-cigarettes. If you need help quitting, ask your health care provider. Do not use drugs. If you are sexually active, practice safe sex. Use a condom or other form of protection to prevent STIs. Take aspirin only as told by your health care provider. Make sure that you understand how much to take and what form to take. Work with your health care provider to find out whether it is safe  and beneficial for you to take aspirin daily. Ask your health care provider if you need to take a cholesterol-lowering medicine (statin). Find healthy ways to manage stress, such  as: Meditation, yoga, or listening to music. Journaling. Talking to a trusted person. Spending time with friends and family. Safety Always wear your seat belt while driving or riding in a vehicle. Do not drive: If you have been drinking alcohol. Do not ride with someone who has been drinking. When you are tired or distracted. While texting. If you have been using any mind-altering substances or drugs. Wear a helmet and other protective equipment during sports activities. If you have firearms in your house, make sure you follow all gun safety procedures. Minimize exposure to UV radiation to reduce your risk of skin cancer. What's next? Visit your health care provider once a year for an annual wellness visit. Ask your health care provider how often you should have your eyes and teeth checked. Stay up to date on all vaccines. This information is not intended to replace advice given to you by your health care provider. Make sure you discuss any questions you have with your health care provider. Document Revised: 08/30/2020 Document Reviewed: 08/30/2020 Elsevier Patient Education  2024 [employer].

## 2023-07-06 DIAGNOSIS — Z0000 Encounter for general adult medical examination without abnormal findings: Secondary | ICD-10-CM | POA: Insufficient documentation

## 2023-07-06 NOTE — Assessment & Plan Note (Signed)
 Well adult Orders Placed This Encounter  Procedures   CMP14+EGFR   CBC with Differential/Platelet   PSA   Lipid Panel With LDL/HDL Ratio   Vitamin D  (25 hydroxy)   HgB A1c   Sed Rate (ESR)   C-reactive protein   ANA,IFA RA Diag Pnl w/rflx Tit/Patn   Uric acid  Screenings: Per lab orders Immunizations: He will consider pneumonia vaccine. Anticipatory guidance/risk factor reduction: Recommendations per AVS.

## 2023-07-09 LAB — ANA,IFA RA DIAG PNL W/RFLX TIT/PATN
Cyclic Citrullin Peptide Ab: 6 U (ref 0–19)
Rheumatoid fact SerPl-aCnc: <10 [IU]/mL (ref <14.0)

## 2023-07-09 LAB — VITAMIN D 25 HYDROXY (VIT D DEFICIENCY, FRACTURES): Vit D, 25-Hydroxy: 17.3 ng/mL — ABNORMAL LOW (ref 30.0–100.0)

## 2023-07-09 LAB — CBC WITH DIFFERENTIAL/PLATELET
Basophils Absolute: 0.1 10*3/uL (ref 0.0–0.2)
Basos: 1 %
EOS (ABSOLUTE): 0.2 10*3/uL (ref 0.0–0.4)
Eos: 4 %
Hematocrit: 44.9 % (ref 37.5–51.0)
Hemoglobin: 15.1 g/dL (ref 13.0–17.7)
Immature Grans (Abs): 0 10*3/uL (ref 0.0–0.1)
Immature Granulocytes: 0 %
Lymphocytes Absolute: 2.3 10*3/uL (ref 0.7–3.1)
Lymphs: 35 %
MCH: 29.8 pg (ref 26.6–33.0)
MCHC: 33.6 g/dL (ref 31.5–35.7)
MCV: 89 fL (ref 79–97)
Monocytes Absolute: 0.4 10*3/uL (ref 0.1–0.9)
Monocytes: 6 %
Neutrophils Absolute: 3.6 10*3/uL (ref 1.4–7.0)
Neutrophils: 54 %
Platelets: 309 10*3/uL (ref 150–450)
RBC: 5.06 x10E6/uL (ref 4.14–5.80)
RDW: 12.7 % (ref 11.6–15.4)
WBC: 6.6 10*3/uL (ref 3.4–10.8)

## 2023-07-09 LAB — CMP14+EGFR
ALT: 26 IU/L (ref 0–44)
AST: 24 IU/L (ref 0–40)
Albumin: 4.6 g/dL (ref 3.9–4.9)
Alkaline Phosphatase: 62 IU/L (ref 44–121)
BUN/Creatinine Ratio: 20 (ref 10–24)
BUN: 20 mg/dL (ref 8–27)
Bilirubin Total: 0.7 mg/dL (ref 0.0–1.2)
CO2: 21 mmol/L (ref 20–29)
Calcium: 9.7 mg/dL (ref 8.6–10.2)
Chloride: 99 mmol/L (ref 96–106)
Creatinine, Ser: 1.02 mg/dL (ref 0.76–1.27)
Globulin, Total: 2.2 g/dL (ref 1.5–4.5)
Glucose: 131 mg/dL — ABNORMAL HIGH (ref 70–99)
Potassium: 4.6 mmol/L (ref 3.5–5.2)
Sodium: 135 mmol/L (ref 134–144)
Total Protein: 6.8 g/dL (ref 6.0–8.5)
eGFR: 81 mL/min/{1.73_m2} (ref >59)

## 2023-07-09 LAB — URIC ACID: Uric Acid: 6.6 mg/dL (ref 3.8–8.4)

## 2023-07-09 LAB — LIPID PANEL WITH LDL/HDL RATIO
Cholesterol, Total: 264 mg/dL — ABNORMAL HIGH (ref 100–199)
HDL: 54 mg/dL (ref >39)
LDL Chol Calc (NIH): 183 mg/dL — ABNORMAL HIGH (ref 0–99)
LDL/HDL Ratio: 3.4 ratio (ref 0.0–3.6)
Triglycerides: 148 mg/dL (ref 0–149)
VLDL Cholesterol Cal: 27 mg/dL (ref 5–40)

## 2023-07-09 LAB — SEDIMENTATION RATE: Sed Rate: 7 mm/h (ref 0–30)

## 2023-07-09 LAB — PSA: Prostate Specific Ag, Serum: 6.1 ng/mL — ABNORMAL HIGH (ref 0.0–4.0)

## 2023-07-09 LAB — C-REACTIVE PROTEIN: CRP: 1 mg/L (ref 0–10)

## 2023-07-09 LAB — HEMOGLOBIN A1C
Est. average glucose Bld gHb Est-mCnc: 151 mg/dL
Hgb A1c MFr Bld: 6.9 % — ABNORMAL HIGH (ref 4.8–5.6)

## 2023-07-18 ENCOUNTER — Encounter: Payer: Self-pay | Admitting: Family Medicine

## 2023-07-18 ENCOUNTER — Other Ambulatory Visit: Payer: Self-pay | Admitting: Family Medicine

## 2023-07-18 DIAGNOSIS — R972 Elevated prostate specific antigen [PSA]: Secondary | ICD-10-CM

## 2023-07-18 MED ORDER — VITAMIN D (ERGOCALCIFEROL) 1.25 MG (50000 UNIT) PO CAPS: 50000.0000 [IU] | ORAL_CAPSULE | ORAL | 1 refills | Status: DC

## 2023-07-23 MED ORDER — ATORVASTATIN CALCIUM 20 MG PO TABS: 20.0000 mg | ORAL_TABLET | Freq: Every day | ORAL | 3 refills | Status: AC

## 2023-07-23 MED ORDER — OZEMPIC (0.25 OR 0.5 MG/DOSE) 2 MG/3ML ~~LOC~~ SOPN: PEN_INJECTOR | SUBCUTANEOUS | 2 refills | Status: DC

## 2023-07-23 NOTE — Addendum Note (Signed)
 Addended by: Abundio Hoit on: 07/23/2023 09:51 AM   Modules accepted: Orders

## 2023-07-24 ENCOUNTER — Telehealth: Payer: Self-pay

## 2023-07-24 NOTE — Telephone Encounter (Signed)
 Pharmacy Patient Advocate Encounter   Received notification from CoverMyMeds that prior authorization for Ozempic (0.25 or 0.5 MG/DOSE) 2MG /3ML pen-injectors is required/requested.   Insurance verification completed.   The patient is insured through [facility] .   Per test claim: PA required; PA submitted to above mentioned insurance via CoverMyMeds Key/confirmation #/EOC [id number] Status is pending

## 2023-07-25 ENCOUNTER — Other Ambulatory Visit (HOSPITAL_COMMUNITY): Payer: Self-pay

## 2023-07-25 NOTE — Telephone Encounter (Signed)
 Pharmacy Patient Advocate Encounter  Received notification from [employer] that Prior Authorization for Ozempic  (0.25 or 0.5 MG/DOSE) 2MG /3ML pen-injectors  has been APPROVED from 07/24/23 to 07/23/24. Ran test claim, Copay is $5. This test claim was processed through [facility]- copay amounts may vary at other pharmacies due to pharmacy/plan contracts, or as the patient moves through the different stages of their insurance plan.   PA #/Case ID/Reference #: [id number]

## 2023-08-19 NOTE — Progress Notes (Incomplete)
    Chief Complaint: Elevated PSA  History of Present Illness:  Spencer Wang is a 68 y.o. male who is seen in consultation from Adela Holter, DO for evaluation and management of elevated PSA.Aaron Aas  Previous PSA data as follows:  December 2011--1.0 August 2020--2.23 December 2019--2.10 December 2020--2.11 January 2022--3.46 July 03, 2023--6.1   Past Medical History:  Past Medical History:  Diagnosis Date   Hyperlipidemia    Hypertension     Past Surgical History:  Past Surgical History:  Procedure Laterality Date   APPENDECTOMY      Allergies:  No Known Allergies  Family History:  Family History  Problem Relation Age of Onset   Hypertension Father    Hyperlipidemia Father     Social History:  Social History   Tobacco Use   Smoking status: Former    Current packs/day: 0.00    Types: Cigarettes    Quit date: 03/19/1995    Years since quitting: 28.4   Smokeless tobacco: Never  Substance Use Topics   Alcohol use: Yes    Alcohol/week: 0.0 standard drinks of alcohol   Drug use: No    Review of symptoms:  Constitutional:  Negative for unexplained weight loss, night sweats, fever, chills ENT:  Negative for nose bleeds, sinus pain, painful swallowing CV:  Negative for chest pain, shortness of breath, exercise intolerance, palpitations, loss of consciousness Resp:  Negative for cough, wheezing, shortness of breath GI:  Negative for nausea, vomiting, diarrhea, bloody stools GU:  Positives noted in HPI; otherwise negative for gross hematuria, dysuria, urinary incontinence Neuro:  Negative for seizures, poor balance, limb weakness, slurred speech Psych:  Negative for lack of energy, depression, anxiety Endocrine:  Negative for polydipsia, polyuria, symptoms of hypoglycemia (dizziness, hunger, sweating) Hematologic:  Negative for anemia, purpura, petechia, prolonged or excessive bleeding, use of anticoagulants  Allergic:  Negative for difficulty breathing or choking  as a result of exposure to anything; no shellfish allergy; no allergic response (rash/itch) to materials, foods  Physical exam: There were no vitals taken for this visit. GENERAL APPEARANCE:  Well appearing, well developed, well nourished, NAD HEENT: Atraumatic, Normocephalic. NECK: Normal appearance LUNGS: Normal inspiratory and expiratory excursion HEART: Regular Rate ABDOMEN: ***. GU: Phallus normal, no lesions. Scrotal skin normal. Testicles/epididymal structures normal. Meatus normal. Normal anal sphincter tone, prostate ***mL, symmetric, non nodular, non tender. EXTREMITIES: Moves all extremities well.  Without clubbing, cyanosis, or edema. NEUROLOGIC:  Alert and oriented x 3, normal gait, CN II-XII grossly intact.  MENTAL STATUS:  Appropriate. SKIN:  Warm, dry and intact.    Results:   I have reviewed referring/prior physicians notes  I have reviewed urinalysis  I have reviewed PSA results  I have reviewed prior imaging   Assessment: ***   Plan: ***

## 2023-08-20 ENCOUNTER — Ambulatory Visit: Admitting: Urology

## 2023-08-20 DIAGNOSIS — R351 Nocturia: Secondary | ICD-10-CM

## 2023-08-20 DIAGNOSIS — R350 Frequency of micturition: Secondary | ICD-10-CM

## 2023-08-22 ENCOUNTER — Encounter: Payer: Self-pay | Admitting: Urology

## 2023-08-22 ENCOUNTER — Ambulatory Visit (INDEPENDENT_AMBULATORY_CARE_PROVIDER_SITE_OTHER): Admitting: Urology

## 2023-08-22 VITALS — BP 151/97 | HR 83 | Ht 66.0 in | Wt 200.0 lb

## 2023-08-22 DIAGNOSIS — R350 Frequency of micturition: Secondary | ICD-10-CM

## 2023-08-22 DIAGNOSIS — N419 Inflammatory disease of prostate, unspecified: Secondary | ICD-10-CM

## 2023-08-22 DIAGNOSIS — R351 Nocturia: Secondary | ICD-10-CM

## 2023-08-22 DIAGNOSIS — R972 Elevated prostate specific antigen [PSA]: Secondary | ICD-10-CM

## 2023-08-22 LAB — URINALYSIS, ROUTINE W REFLEX MICROSCOPIC
Bilirubin, UA: NEGATIVE
Glucose, UA: NEGATIVE
Ketones, UA: NEGATIVE
Leukocytes,UA: NEGATIVE
Nitrite, UA: NEGATIVE
Protein,UA: NEGATIVE
RBC, UA: NEGATIVE
Specific Gravity, UA: 1.02 (ref 1.005–1.030)
Urobilinogen, Ur: 0.2 mg/dL (ref 0.2–1.0)
pH, UA: 5.5 (ref 5.0–7.5)

## 2023-08-22 MED ORDER — SULFAMETHOXAZOLE-TRIMETHOPRIM 800-160 MG PO TABS: 1.0000 | ORAL_TABLET | Freq: Two times a day (BID) | ORAL | 0 refills | Status: AC

## 2023-08-22 NOTE — Progress Notes (Signed)
 Assessment: 1. Prostatitis, unspecified prostatitis type   2. Elevated PSA   3. Nocturia   4. Urinary frequency     Plan: I personally reviewed the patient's chart including provider notes, and lab results. Today I had a long discussion with the patient regarding PSA and the rationale and controversies of prostate cancer early detection.  I discussed the pros and cons of further evaluation including TRUS and prostate Bx.  Potential adverse events and complications as well as standard instructions were given.  Patient expressed his understanding of these issues. His digital rectal exam is consistent with prostatitis.  The association with prostatitis and elevated PSA discussed with the patient today. Recommend a course of antibiotics for treatment of prostatitis. Bactrim DS twice daily x 21 days.  Prescription sent. Repeat PSA and follow-up  in approximately 2 months.  Chief Complaint:  Chief Complaint  Patient presents with   Elevated PSA    History of Present Illness:  Spencer Wang is a 68 y.o. male who is seen for evaluation of elevated PSA. PSA results: 8/20 2.7 10/21 2.25 9/22 2.27 10/23 3.46 4/25 6.1  No prior history of elevated PSA.  No prior prostate biopsy.  No history of UTIs or prostatitis.  He was previously evaluated for symptoms of nocturia and urinary frequency.  At his initial visit in 12/23, he reported urinary symptoms for a number of years.  He had previously been evaluated by urology while living in [place].  He underwent evaluation with cystoscopy as well as other testing which he reported was normal.  No records available for review.  He had symptoms of frequency, voiding every 2 hours, urgency, and nocturia 2-3 times.  He also reported some hesitancy and decreased stream, primarily at night.  He noticed some dribbling if he attempts to postpone urination.  No dysuria or gross hematuria.  No history of UTIs or prostatitis.  He previously tried tamsulosin   without improvement in his symptoms.  He does have a history of sleep apnea but was not using a CPAP mask. IPSS = 15 QOL = 3/6 PVR = 25 ml. He was given a trial of Myrbetriq  25 mg daily in 12/23. At his visit in 1/24, he reported some improvement with the Myrbetriq  but the benefit decreased after several days.  He continued to have nocturia, frequency, and urgency.  No dysuria or gross hematuria. IPSS = 18 QOL = 2/6 He was started on Myrbetriq  50 mg daily. At his visit in February 2024, he continued on Myrbetriq  50 mg daily.  He continued to have daytime frequency, urgency, and nocturia x 3.  He did not feel like the Myrbetriq  had improved his symptoms.  No dysuria or gross hematuria. IPSS = 16 QOL = 3/6   Review of 24-hour voiding diary shows small volume output with each void (<200 mL), nocturnal output of 31% and 22% recorded. He was given a trial of Vesicare  at that time.  He reports no improvement in his symptoms with Vesicare .  He discontinued the medication.  He has most recently tried an over-the-counter medicine called No-Spa.  He reports some minimal benefit with this medication. He can use with urgency, hesitancy, decreased stream and frequency. No dysuria or gross hematuria. IPSS = 20/1.  Portions of the above documentation were copied from a prior visit for review purposes only.  Past Medical History:  Past Medical History:  Diagnosis Date   Hyperlipidemia    Hypertension     Past Surgical History:  Past  Surgical History:  Procedure Laterality Date   APPENDECTOMY      Allergies:  No Known Allergies  Family History:  Family History  Problem Relation Age of Onset   Hypertension Father    Hyperlipidemia Father     Social History:  Social History   Tobacco Use   Smoking status: Former    Current packs/day: 0.00    Types: Cigarettes    Quit date: 03/19/1995    Years since quitting: 28.4   Smokeless tobacco: Never  Substance Use Topics   Alcohol use: Yes     Alcohol/week: 0.0 standard drinks of alcohol   Drug use: No    ROS: Constitutional:  Negative for fever, chills, weight loss CV: Negative for chest pain, previous MI, hypertension Respiratory:  Negative for shortness of breath, wheezing, sleep apnea, frequent cough GI:  Negative for nausea, vomiting, bloody stool, GERD  Physical exam: BP (!) 151/97   Pulse 83   Ht 5\' 6"  (1.676 m)   Wt 200 lb (90.7 kg)   BMI 32.28 kg/m  GENERAL APPEARANCE:  Well appearing, well developed, well nourished, NAD HEENT:  Atraumatic, normocephalic, oropharynx clear NECK:  Supple without lymphadenopathy or thyromegaly ABDOMEN:  Soft, non-tender, no masses EXTREMITIES:  Moves all extremities well, without clubbing, cyanosis, or edema NEUROLOGIC:  Alert and oriented x 3, normal gait, CN II-XII grossly intact MENTAL STATUS:  appropriate BACK:  Non-tender to palpation, No CVAT SKIN:  Warm, dry, and intact GU: Prostate: 40 g, tender to palpation Rectum: Normal tone,  no masses or tenderness   Results: U/A: Negative

## 2023-09-09 ENCOUNTER — Other Ambulatory Visit: Payer: Self-pay | Admitting: Urology

## 2023-09-09 DIAGNOSIS — N419 Inflammatory disease of prostate, unspecified: Secondary | ICD-10-CM

## 2023-10-19 ENCOUNTER — Other Ambulatory Visit: Payer: Self-pay | Admitting: Family Medicine

## 2023-10-20 ENCOUNTER — Other Ambulatory Visit

## 2023-10-20 DIAGNOSIS — R972 Elevated prostate specific antigen [PSA]: Secondary | ICD-10-CM

## 2023-10-21 LAB — PSA: Prostate Specific Ag, Serum: 6 ng/mL — ABNORMAL HIGH (ref 0.0–4.0)

## 2023-10-27 ENCOUNTER — Encounter: Payer: Self-pay | Admitting: Urology

## 2023-10-27 ENCOUNTER — Ambulatory Visit: Admitting: Urology

## 2023-10-27 VITALS — BP 123/83 | HR 76 | Ht 69.0 in | Wt 190.0 lb

## 2023-10-27 DIAGNOSIS — R399 Unspecified symptoms and signs involving the genitourinary system: Secondary | ICD-10-CM | POA: Diagnosis not present

## 2023-10-27 DIAGNOSIS — R972 Elevated prostate specific antigen [PSA]: Secondary | ICD-10-CM | POA: Diagnosis not present

## 2023-10-27 DIAGNOSIS — N419 Inflammatory disease of prostate, unspecified: Secondary | ICD-10-CM | POA: Diagnosis not present

## 2023-10-27 LAB — URINALYSIS, ROUTINE W REFLEX MICROSCOPIC
Bilirubin, UA: NEGATIVE
Glucose, UA: NEGATIVE
Ketones, UA: NEGATIVE
Leukocytes,UA: NEGATIVE
Nitrite, UA: NEGATIVE
Protein,UA: NEGATIVE
Specific Gravity, UA: 1.025 (ref 1.005–1.030)
Urobilinogen, Ur: 0.2 mg/dL (ref 0.2–1.0)
pH, UA: 5.5 (ref 5.0–7.5)

## 2023-10-27 LAB — MICROSCOPIC EXAMINATION

## 2023-10-27 MED ORDER — TAMSULOSIN HCL 0.4 MG PO CAPS: 0.4000 mg | ORAL_CAPSULE | Freq: Every day | ORAL | 11 refills | Status: AC

## 2023-10-27 NOTE — Progress Notes (Signed)
 Assessment: 1. Elevated PSA   2. Lower urinary tract symptoms (LUTS)   3. Prostatitis, unspecified prostatitis type     Plan: I discussed further evaluation of his elevated PSA with additional biomarkers, prostate MRI, and prostate biopsy. Recommend further evaluation with a prostate MRI. Trial of tamsulosin  0.4 mg daily.  Prescription sent. Will contact him with results of MRI when available.  Chief Complaint:  Chief Complaint  Patient presents with   Prostatitis    History of Present Illness:  Spencer Wang is a 68 y.o. male who is seen for evaluation of elevated PSA. PSA results: 8/20 2.7 10/21 2.25 9/22 2.27 10/23 3.46 4/25 6.1 8/25 6.0  No prior history of elevated PSA.  No prior prostate biopsy.  No history of UTIs or prostatitis.  He was previously evaluated for symptoms of nocturia and urinary frequency.  At his initial visit in 12/23, he reported urinary symptoms for a number of years.  He had previously been evaluated by urology while living in [place].  He underwent evaluation with cystoscopy as well as other testing which he reported was normal.  No records available for review.  He had symptoms of frequency, voiding every 2 hours, urgency, and nocturia 2-3 times.  He also reported some hesitancy and decreased stream, primarily at night.  He noticed some dribbling if he attempts to postpone urination.  No dysuria or gross hematuria.  No history of UTIs or prostatitis.  He previously tried tamsulosin  without improvement in his symptoms.  He does have a history of sleep apnea but was not using a CPAP mask. IPSS = 15 QOL = 3/6 PVR = 25 ml. He was given a trial of Myrbetriq  25 mg daily in 12/23. At his visit in 1/24, he reported some improvement with the Myrbetriq  but the benefit decreased after several days.  He continued to have nocturia, frequency, and urgency.  No dysuria or gross hematuria. IPSS = 18 QOL = 2/6 He was started on Myrbetriq  50 mg daily. At his  visit in February 2024, he continued on Myrbetriq  50 mg daily.  He continued to have daytime frequency, urgency, and nocturia x 3.  He did not feel like the Myrbetriq  had improved his symptoms.  No dysuria or gross hematuria. IPSS = 16 QOL = 3/6   Review of 24-hour voiding diary shows small volume output with each void (<200 mL), nocturnal output of 31% and 22% recorded. He was given a trial of Vesicare  at that time.  He reported no improvement in his symptoms with Vesicare  and he discontinued the medication.  He tried an over-the-counter medicine called No-Spa.  He reported some minimal benefit with this medication. He continued with urgency, hesitancy, decreased stream and frequency. No dysuria or gross hematuria. IPSS = 20/1. His exam in June 2025 was consistent with prostatitis.  He was treated with Bactrim  DS x 21 days.  He returns today for follow-up.  He has completed the Bactrim .  He noted some improvement in his lower urinary tract symptoms after he finished the antibiotics.  He has since noted a return of his symptoms.  He continues to have lower urinary tract symptoms with urgency, hesitancy, and weak stream.  He also has nocturia x 2-3.  No dysuria or gross hematuria. IPSS = 22/3.  Portions of the above documentation were copied from a prior visit for review purposes only.  Past Medical History:  Past Medical History:  Diagnosis Date   Hyperlipidemia    Hypertension  Past Surgical History:  Past Surgical History:  Procedure Laterality Date   APPENDECTOMY      Allergies:  No Known Allergies  Family History:  Family History  Problem Relation Age of Onset   Hypertension Father    Hyperlipidemia Father     Social History:  Social History   Tobacco Use   Smoking status: Former    Current packs/day: 0.00    Types: Cigarettes    Quit date: 03/19/1995    Years since quitting: 28.6   Smokeless tobacco: Never  Substance Use Topics   Alcohol use: Yes     Alcohol/week: 0.0 standard drinks of alcohol   Drug use: No    ROS: Constitutional:  Negative for fever, chills, weight loss CV: Negative for chest pain, previous MI, hypertension Respiratory:  Negative for shortness of breath, wheezing, sleep apnea, frequent cough GI:  Negative for nausea, vomiting, bloody stool, GERD  Physical exam: BP 123/83   Pulse 76   Ht 5' 9 (1.753 m)   Wt 190 lb (86.2 kg)   BMI 28.06 kg/m  GENERAL APPEARANCE:  Well appearing, well developed, well nourished, NAD HEENT:  Atraumatic, normocephalic, oropharynx clear NECK:  Supple without lymphadenopathy or thyromegaly ABDOMEN:  Soft, non-tender, no masses EXTREMITIES:  Moves all extremities well, without clubbing, cyanosis, or edema NEUROLOGIC:  Alert and oriented x 3, normal gait, CN II-XII grossly intact MENTAL STATUS:  appropriate BACK:  Non-tender to palpation, No CVAT SKIN:  Warm, dry, and intact   Results: U/A: 0-5 WBCs, 0-2 RBCs

## 2023-11-02 ENCOUNTER — Other Ambulatory Visit: Payer: Self-pay | Admitting: Family Medicine

## 2023-11-02 DIAGNOSIS — G4700 Insomnia, unspecified: Secondary | ICD-10-CM

## 2023-11-18 ENCOUNTER — Encounter: Payer: Self-pay | Admitting: Sports Medicine

## 2023-12-29 ENCOUNTER — Other Ambulatory Visit: Payer: Self-pay | Admitting: Family Medicine

## 2023-12-30 ENCOUNTER — Ambulatory Visit (HOSPITAL_BASED_OUTPATIENT_CLINIC_OR_DEPARTMENT_OTHER)
Admission: RE | Admit: 2023-12-30 | Discharge: 2023-12-30 | Disposition: A | Discharge status: Home / Self Care | Source: Ambulatory Visit | Attending: Urology | Admitting: Urology

## 2023-12-30 DIAGNOSIS — R972 Elevated prostate specific antigen [PSA]: Secondary | ICD-10-CM | POA: Diagnosis present

## 2023-12-30 MED ORDER — GADOBUTROL 1 MMOL/ML IV SOLN
8.6000 mL | Freq: Once | INTRAVENOUS | Status: AC | PRN
  Administered 2023-12-30: 8.6 mL via INTRAVENOUS

## 2023-12-31 ENCOUNTER — Ambulatory Visit: Payer: Self-pay | Admitting: Urology

## 2024-01-01 ENCOUNTER — Other Ambulatory Visit: Payer: Self-pay | Admitting: Urology

## 2024-01-01 DIAGNOSIS — R972 Elevated prostate specific antigen [PSA]: Secondary | ICD-10-CM

## 2024-01-06 ENCOUNTER — Ambulatory Visit (HOSPITAL_BASED_OUTPATIENT_CLINIC_OR_DEPARTMENT_OTHER)

## 2024-01-13 ENCOUNTER — Other Ambulatory Visit: Payer: Self-pay | Admitting: Family Medicine

## 2024-02-11 ENCOUNTER — Other Ambulatory Visit: Payer: Self-pay | Admitting: Urology

## 2024-02-11 ENCOUNTER — Encounter: Payer: Self-pay | Admitting: Urology

## 2024-02-11 ENCOUNTER — Telehealth: Payer: Self-pay | Admitting: Urology

## 2024-02-11 DIAGNOSIS — N138 Other obstructive and reflux uropathy: Secondary | ICD-10-CM

## 2024-02-11 DIAGNOSIS — N419 Inflammatory disease of prostate, unspecified: Secondary | ICD-10-CM

## 2024-02-11 MED ORDER — FINASTERIDE 5 MG PO TABS: 5.0000 mg | ORAL_TABLET | Freq: Every day | ORAL | 11 refills | Status: AC

## 2024-02-11 MED ORDER — SULFAMETHOXAZOLE-TRIMETHOPRIM 800-160 MG PO TABS: 1.0000 | ORAL_TABLET | Freq: Two times a day (BID) | ORAL | 0 refills | Status: AC

## 2024-02-11 NOTE — Telephone Encounter (Signed)
 Biopsy results reviewed. No evidence of malignancy.  Evidence of chronic inflammation. I discussed the results with the patient by phone today. He is doing well following the biopsy. He continues to have some symptoms consistent with prostatitis. Prescription for Bactrim  twice daily x 14 days sent to his pharmacy. Given the large volume prostate, recommend finasteride  5 mg daily. Prescription sent. Recommend follow-up in 6 months.

## 2024-04-01 ENCOUNTER — Other Ambulatory Visit: Payer: Self-pay | Admitting: Family Medicine

## 2024-04-10 ENCOUNTER — Other Ambulatory Visit: Payer: Self-pay | Admitting: Family Medicine

## 2024-08-16 ENCOUNTER — Ambulatory Visit: Admitting: Urology
